# Patient Record
Sex: Female | Born: 1945 | Race: White | Hispanic: No | Marital: Married | Smoking: Never smoker
Health system: Southern US, Community
[De-identification: ages and names within clinical notes are randomized; demographics above are authoritative.]

## PROBLEM LIST (undated history)

## (undated) DIAGNOSIS — K219 Gastro-esophageal reflux disease without esophagitis: Secondary | ICD-10-CM

## (undated) DIAGNOSIS — T7840XA Allergy, unspecified, initial encounter: Secondary | ICD-10-CM

## (undated) DIAGNOSIS — I10 Essential (primary) hypertension: Secondary | ICD-10-CM

## (undated) DIAGNOSIS — E0590 Thyrotoxicosis, unspecified without thyrotoxic crisis or storm: Secondary | ICD-10-CM

## (undated) DIAGNOSIS — R112 Nausea with vomiting, unspecified: Secondary | ICD-10-CM

## (undated) DIAGNOSIS — H33319 Horseshoe tear of retina without detachment, unspecified eye: Secondary | ICD-10-CM

## (undated) DIAGNOSIS — E785 Hyperlipidemia, unspecified: Secondary | ICD-10-CM

## (undated) DIAGNOSIS — Z9889 Other specified postprocedural states: Secondary | ICD-10-CM

## (undated) DIAGNOSIS — Z87442 Personal history of urinary calculi: Secondary | ICD-10-CM

## (undated) DIAGNOSIS — M1990 Unspecified osteoarthritis, unspecified site: Secondary | ICD-10-CM

## (undated) HISTORY — PX: BARTHOLIN GLAND CYST EXCISION: SHX565

## (undated) HISTORY — DX: Allergy, unspecified, initial encounter: T78.40XA

## (undated) HISTORY — PX: TONSILLECTOMY: SUR1361

## (undated) HISTORY — PX: TUBAL LIGATION: SHX77

## (undated) HISTORY — PX: CHOLECYSTECTOMY: SHX55

## (undated) HISTORY — PX: COLONOSCOPY: SHX174

## (undated) HISTORY — DX: Hyperlipidemia, unspecified: E78.5

## (undated) HISTORY — DX: Horseshoe tear of retina without detachment, unspecified eye: H33.319

## (undated) HISTORY — PX: OTHER SURGICAL HISTORY: SHX169

## (undated) HISTORY — PX: PLANTAR FASCIA SURGERY: SHX746

---

## 1997-11-27 ENCOUNTER — Other Ambulatory Visit: Admission: RE | Admit: 1997-11-27 | Discharge: 1997-11-27 | Payer: Self-pay | Admitting: Obstetrics and Gynecology

## 1998-04-12 HISTORY — PX: KNEE ARTHROSCOPY: SHX127

## 1998-12-11 ENCOUNTER — Other Ambulatory Visit: Admission: RE | Admit: 1998-12-11 | Discharge: 1998-12-11 | Payer: Self-pay | Admitting: Obstetrics and Gynecology

## 1998-12-11 ENCOUNTER — Encounter (INDEPENDENT_AMBULATORY_CARE_PROVIDER_SITE_OTHER): Payer: Self-pay | Admitting: Specialist

## 1999-12-15 ENCOUNTER — Other Ambulatory Visit: Admission: RE | Admit: 1999-12-15 | Discharge: 1999-12-15 | Payer: Self-pay | Admitting: Obstetrics and Gynecology

## 2001-01-17 ENCOUNTER — Other Ambulatory Visit: Admission: RE | Admit: 2001-01-17 | Discharge: 2001-01-17 | Payer: Self-pay | Admitting: Obstetrics and Gynecology

## 2002-02-16 ENCOUNTER — Other Ambulatory Visit: Admission: RE | Admit: 2002-02-16 | Discharge: 2002-02-16 | Payer: Self-pay | Admitting: Obstetrics and Gynecology

## 2002-08-15 ENCOUNTER — Ambulatory Visit (HOSPITAL_COMMUNITY): Admission: RE | Admit: 2002-08-15 | Discharge: 2002-08-15 | Payer: Self-pay | Admitting: Gastroenterology

## 2003-03-15 ENCOUNTER — Other Ambulatory Visit: Admission: RE | Admit: 2003-03-15 | Discharge: 2003-03-15 | Payer: Self-pay | Admitting: Obstetrics and Gynecology

## 2003-09-23 ENCOUNTER — Other Ambulatory Visit: Admission: RE | Admit: 2003-09-23 | Discharge: 2003-09-23 | Payer: Self-pay | Admitting: Diagnostic Radiology

## 2004-04-14 ENCOUNTER — Other Ambulatory Visit: Admission: RE | Admit: 2004-04-14 | Discharge: 2004-04-14 | Payer: Self-pay | Admitting: Obstetrics and Gynecology

## 2004-08-10 ENCOUNTER — Encounter: Admission: RE | Admit: 2004-08-10 | Discharge: 2004-08-10 | Payer: Self-pay | Admitting: Family Medicine

## 2004-08-28 ENCOUNTER — Encounter: Admission: RE | Admit: 2004-08-28 | Discharge: 2004-08-28 | Payer: Self-pay | Admitting: Gastroenterology

## 2004-12-21 ENCOUNTER — Ambulatory Visit (HOSPITAL_COMMUNITY): Admission: RE | Admit: 2004-12-21 | Discharge: 2004-12-21 | Payer: Self-pay | Admitting: General Surgery

## 2004-12-21 ENCOUNTER — Encounter (INDEPENDENT_AMBULATORY_CARE_PROVIDER_SITE_OTHER): Payer: Self-pay | Admitting: Specialist

## 2005-05-18 ENCOUNTER — Other Ambulatory Visit: Admission: RE | Admit: 2005-05-18 | Discharge: 2005-05-18 | Payer: Self-pay | Admitting: Obstetrics and Gynecology

## 2009-07-22 ENCOUNTER — Emergency Department (HOSPITAL_COMMUNITY): Admission: EM | Admit: 2009-07-22 | Discharge: 2009-07-22 | Payer: Self-pay | Admitting: Emergency Medicine

## 2009-08-20 ENCOUNTER — Encounter: Payer: Self-pay | Admitting: Gastroenterology

## 2009-08-22 ENCOUNTER — Encounter (INDEPENDENT_AMBULATORY_CARE_PROVIDER_SITE_OTHER): Payer: Self-pay | Admitting: *Deleted

## 2009-09-09 ENCOUNTER — Encounter (INDEPENDENT_AMBULATORY_CARE_PROVIDER_SITE_OTHER): Payer: Self-pay | Admitting: *Deleted

## 2009-09-12 ENCOUNTER — Ambulatory Visit: Payer: Self-pay | Admitting: Gastroenterology

## 2009-09-23 ENCOUNTER — Ambulatory Visit: Payer: Self-pay | Admitting: Gastroenterology

## 2010-05-14 NOTE — Letter (Signed)
Summary: [facility] Instructions  [facility]  [address] [place], [place] [address]   Phone: [phone]  Fax: [phone]       Deanna Erickson    [birth year]    MRN: [id number]        Procedure Day /Date:  Tuesday 09/23/2009     Arrival Time: 10:30 am      Procedure Time: 11:30 am     Location of Procedure:                    _x _  [facility] (4th Floor)                        PREPARATION FOR COLONOSCOPY WITH MOVIPREP   Starting 5 days prior to your procedure Thursday 6/9 do not eat nuts, seeds, popcorn, corn, beans, peas,  salads, or any raw vegetables.  Do not take any fiber supplements (e.g. Metamucil, Citrucel, and Benefiber).  THE DAY BEFORE YOUR PROCEDURE         DATE: Monday 6/13  1.  Drink clear liquids the entire day-NO SOLID FOOD  2.  Do not drink anything colored red or purple.  Avoid juices with pulp.  No orange juice.  3.  Drink at least 64 oz. (8 glasses) of fluid/clear liquids during the day to prevent dehydration and help the prep work efficiently.  CLEAR LIQUIDS INCLUDE: Water Jello Ice Popsicles Tea (sugar ok, no milk/cream) Powdered fruit flavored drinks Coffee (sugar ok, no milk/cream) Gatorade Juice: apple, white grape, white cranberry  Lemonade Clear bullion, consomm, broth Carbonated beverages (any kind) Strained chicken noodle soup Hard Candy                             4.  In the morning, mix first dose of MoviPrep solution:    Empty 1 Pouch A and 1 Pouch B into the disposable container    Add lukewarm drinking water to the top line of the container. Mix to dissolve    Refrigerate (mixed solution should be used within 24 hrs)  5.  Begin drinking the prep at 5:00 p.m. The MoviPrep container is divided by 4 marks.   Every 15 minutes drink the solution down to the next mark (approximately 8 oz) until the full liter is complete.   6.  Follow completed prep with 16 oz of clear liquid of your choice (Nothing  red or purple).  Continue to drink clear liquids until bedtime.  7.  Before going to bed, mix second dose of MoviPrep solution:    Empty 1 Pouch A and 1 Pouch B into the disposable container    Add lukewarm drinking water to the top line of the container. Mix to dissolve    Refrigerate  THE DAY OF YOUR PROCEDURE      DATE: Tuesday 6/14  Beginning at 6:30 a.m. (5 hours before procedure):         1. Every 15 minutes, drink the solution down to the next mark (approx 8 oz) until the full liter is complete.  2. Follow completed prep with 16 oz. of clear liquid of your choice.    3. You may drink clear liquids until 9:30 am (2 HOURS BEFORE PROCEDURE).   MEDICATION INSTRUCTIONS  Unless otherwise instructed, you should take regular prescription medications with a small sip of water   as early as possible the morning of  your procedure.   Additional medication instructions:  hold diuretic if in with BP med.         OTHER INSTRUCTIONS  You will need a responsible adult at least 66 years of age to accompany you and drive you home.   This person must remain in the waiting room during your procedure.  Wear loose fitting clothing that is easily removed.  Leave jewelry and other valuables at home.  However, you may wish to bring a book to read or  an iPod/MP3 player to listen to music as you wait for your procedure to start.  Remove all body piercing jewelry and leave at home.  Total time from sign-in until discharge is approximately 2-3 hours.  You should go home directly after your procedure and rest.  You can resume normal activities the  day after your procedure.  The day of your procedure you should not:   Drive   Make legal decisions   Operate machinery   Drink alcohol   Return to work  You will receive specific instructions about eating, activities and medications before you leave.    The above instructions have been reviewed and explained to me by   Sherren Kerns RN  September 12, 2009 11:32 AM    I fully understand and can verbalize these instructions _____________________________ Date _________

## 2010-05-14 NOTE — Miscellaneous (Signed)
Summary: previsit/rm  Clinical Lists Changes  Medications: Added new medication of MOVIPREP 100 GM  SOLR (PEG-KCL-NACL-NASULF-NA ASC-C) As per prep instructions. - Signed Rx of MOVIPREP 100 GM  SOLR (PEG-KCL-NACL-NASULF-NA ASC-C) As per prep instructions.;  #1 x 0;  Signed;  Entered by: Sherren Kerns RN;  Authorized by: Louis Meckel MD;  Method used: Electronically to [facility] [address] [place], [place], [place]  [address], Ph: [phone], Fax: [phone] Observations: Added new observation of NKA: T (09/12/2009 10:43)    Prescriptions: MOVIPREP 100 GM  SOLR (PEG-KCL-NACL-NASULF-NA ASC-C) As per prep instructions.  #1 x 0   Entered by:   Sherren Kerns RN   Authorized by:   Louis Meckel MD   Signed by:   Sherren Kerns RN on 09/12/2009   Method used:   Electronically to        [facility] (retail)       [address]       [place], [place]  [address]       Ph: [phone]       Fax: [phone]   RxID:   [id number]

## 2010-05-14 NOTE — Procedures (Signed)
Summary: Colonoscopy  Patient: Deanna Erickson Note: All result statuses are Final unless otherwise noted.  Tests: (1) Colonoscopy (COL)   COL Colonoscopy           DONE     [facility]     [address]     [place], [place]  [address]           COLONOSCOPY PROCEDURE REPORT           PATIENT:  [name], [name]  MR#:  [id number]     BIRTHDATE:  [birth year], 63 yrs. old  GENDER:  female           ENDOSCOPIST:  Barbette Hair. Arlyce Dice, MD     Referred by:           PROCEDURE DATE:  09/23/2009     PROCEDURE:  Diagnostic Colonoscopy     ASA CLASS:  Class II     INDICATIONS:  1) Routine Risk Screening  2) family Hx of polyps           MEDICATIONS:   Fentanyl 75 mcg IV, Versed 9 mg IV           DESCRIPTION OF PROCEDURE:   After the risks benefits and     alternatives of the procedure were thoroughly explained, informed     consent was obtained.  Digital rectal exam was performed and     revealed no abnormalities.   The LB CF-[id number] endoscope     was introduced through the anus and advanced to the cecum, which     was identified by both the appendix and ileocecal valve, without     limitations.  The quality of the prep was excellent, using     MoviPrep.  The instrument was then slowly withdrawn as the colon     was fully examined.     <<PROCEDUREIMAGES>>           FINDINGS:  A normal appearing cecum, ileocecal valve, and     appendiceal orifice were identified. The ascending, hepatic     flexure, transverse, splenic flexure, descending, sigmoid colon,     and rectum appeared unremarkable (see image1, image2, image3,     image5, image7, image8, and image9).   Retroflexed views in the     rectum revealed no abnormalities.    The time to cecum =  3.50     minutes. The scope was then withdrawn (time =  6.0  min) from the     patient and the procedure completed.           COMPLICATIONS:  None           ENDOSCOPIC IMPRESSION:     1) Normal colon     RECOMMENDATIONS:     1)  Continue current colorectal screening recommendations for     "routine risk" patients with a repeat colonoscopy in 10 years.           REPEAT EXAM:  In 10 year(s) for Colonoscopy.           ______________________________     Barbette Hair. Arlyce Dice, MD           CC: Marjory Lies, MD, Richardean Chimera, MD           n.     eSIGNED:   Barbette Hair. Kyli Sorter at 09/23/2009 11:44 AM           Evorn Gong, [id number]  Note: An exclamation mark (!) indicates a result  that was not dispersed into the flowsheet. Document Creation Date: 09/23/2009 11:46 AM _______________________________________________________________________  (1) Order result status: Final Collection or observation date-time: 09/23/2009 11:40 Requested date-time:  Receipt date-time:  Reported date-time:  Referring Physician:   Ordering Physician: Melvia Heaps [phone]) Specimen Source:  Source: Launa Grill Order Number: [id number] Lab site:   Appended Document: Colonoscopy    Clinical Lists Changes  Observations: Added new observation of COLONNXTDUE: 09/2019 (09/23/2009 13:08)

## 2010-05-14 NOTE — Letter (Signed)
Summary: Previsit letter  [facility]  [address] [place], [place] [address]   Phone: [phone]  Fax: [phone]       08/22/2009 MRN: [id number]  [facility] [address] [place], [place]  [address]  Dear Ms. Slabaugh,  Welcome to the Gastroenterology Division at [facility].    You are scheduled to see a nurse for your pre-procedure visit on 09-09-09 at 10:00a.m. on the 3rd floor at [facility], [address]  We ask that you try to arrive at our office 15 minutes prior to your appointment time to allow for check-in.  Your nurse visit will consist of discussing your medical and surgical history, your immediate family medical history, and your medications.    Please bring a complete list of all your medications or, if you prefer, bring the medication bottles and we will list them.  We will need to be aware of both prescribed and over the counter drugs.  We will need to know exact dosage information as well.  If you are on blood thinners (Coumadin, Plavix, Aggrenox, Ticlid, etc.) please call our office today/prior to your appointment, as we need to consult with your physician about holding your medication.   Please be prepared to read and sign documents such as consent forms, a financial agreement, and acknowledgement forms.  If necessary, and with your consent, a friend or relative is welcome to sit-in on the nurse visit with you.  Please bring your insurance card so that we may make a copy of it.  If your insurance requires a referral to see a specialist, please bring your referral form from your primary care physician.  No co-pay is required for this nurse visit.     If you cannot keep your appointment, please call [phone] to cancel or reschedule prior to your appointment date.  This allows [place] the opportunity to schedule an appointment for another patient in need of care.    Thank you for choosing [facility] for your medical  needs.  We appreciate the opportunity to care for you.  Please visit [place] at our website  to learn more about our practice.                     Sincerely.                                                                                                                   The Gastroenterology Division

## 2010-05-14 NOTE — Letter (Signed)
Summary: [facility]   Imported By: Lester Fairdale 10/08/2009 10:35:52  _____________________________________________________________________  External Attachment:    Type:   Image     Comment:   External Document

## 2010-07-01 LAB — BASIC METABOLIC PANEL
BUN: 7 mg/dL (ref 6–23)
Creatinine, Ser: 0.79 mg/dL (ref 0.4–1.2)
GFR calc Af Amer: >60 mL/min (ref >60)
GFR calc non Af Amer: >60 mL/min (ref >60)
Glucose, Bld: 118 mg/dL — ABNORMAL HIGH (ref 70–99)
Potassium: 4.3 mEq/L (ref 3.5–5.1)

## 2010-07-01 LAB — DIFFERENTIAL
Eosinophils Absolute: 0 10*3/uL (ref 0.0–0.7)
Lymphs Abs: 1.2 10*3/uL (ref 0.7–4.0)
Monocytes Absolute: 0.3 10*3/uL (ref 0.1–1.0)

## 2010-07-01 LAB — URINALYSIS, ROUTINE W REFLEX MICROSCOPIC
Ketones, ur: NEGATIVE mg/dL
Nitrite: NEGATIVE
pH: 8 (ref 5.0–8.0)

## 2010-07-01 LAB — CBC
HCT: 38.8 % (ref 36.0–46.0)
MCHC: 33.8 g/dL (ref 30.0–36.0)
MCV: 93.9 fL (ref 78.0–100.0)
Platelets: 245 10*3/uL (ref 150–400)
RDW: 13.4 % (ref 11.5–15.5)

## 2010-07-01 LAB — POCT CARDIAC MARKERS

## 2010-08-28 NOTE — Op Note (Signed)
   NAME:  Deanna Erickson, ARVANITIS                            ACCOUNT NO.:  [id number]   MEDICAL RECORD NO.:  [id number]                   PATIENT TYPE:  AMB   LOCATION:  ENDO                                 FACILITY:  [facility]   PHYSICIAN:  Anselmo Rod, M.D.               DATE OF BIRTH:  1945/11/26   DATE OF PROCEDURE:  08/15/2002  DATE OF DISCHARGE:                                 OPERATIVE REPORT   PROCEDURE PERFORMED:  Screening colonoscopy.   ENDOSCOPIST:  Anselmo Rod, M.D.   INSTRUMENT USED:  Olympus video colonoscope   INDICATIONS FOR PROCEDURE:  A 65 year old white female underwent screening  colonoscopy. Rule out colonic polyps, masses, etc.   PREPROCEDURE PREPARATION:  Informed consent was procured from the patient.  The patient was fasted for eight hours prior to the procedure and prepped  with a bottle of MiraLax and Gatorade the night prior to the procedure.   PREPROCEDURE PHYSICAL:  VITAL SIGNS:  The patient had stable vital signs.  NECK:  Supple.  CHEST:  Clear to auscultation. S1 and S2 regular.  ABDOMEN:  Soft with normal bowel sounds.   DESCRIPTION OF PROCEDURE:  The patient was placed in the left lateral  decubitus position and sedated with 100 mg of Demerol and 10 mg of Versed  intravenously. Once the patient was adequately sedated and maintained on low  oxygen and continuous cardiac monitoring, the Olympus video colonoscope was  advanced into the rectum to the cecum.  The appendiceal orifice and  ileocecal valve were clearly visualized and photographed.  Small internal  hemorrhoids were seen on retroflexion of the rectum.  No masses or polyps  were present. The patient tolerated the procedure well without  complications.   IMPRESSION:  Essentially normal colonoscopy except for small nonbleeding  internal hemorrhoids.   RECOMMENDATIONS:  1. High fiber diet with liberal fluid intake has been recommended.  2.     Outpatient follow up on a p.r.n. basis.  3.  Repeat colorectal cancer screening is recommended in the next five years     unless the patient develops any abnormal symptoms in the interim.                                               Anselmo Rod, M.D.    JNM/MEDQ  D:  08/15/2002  T:  08/16/2002  Job:  [id number]   cc:   Teena Irani. Arlyce Dice, M.D.  [address]  [place]  [place] [address]  Fax: [phone]

## 2010-08-28 NOTE — Op Note (Signed)
NAME:  Deanna Erickson, Deanna Erickson                ACCOUNT NO.:  [id number]   MEDICAL RECORD NO.:  [id number]          PATIENT TYPE:  AMB   LOCATION:  DAY                          FACILITY:  [facility]   PHYSICIAN:  Adolph Pollack, M.D.DATE OF BIRTH:  Feb 18, 1946   DATE OF PROCEDURE:  DATE OF DISCHARGE:                                 OPERATIVE REPORT   PREOPERATIVE DIAGNOSIS:  Gallbladder polypoid mass.   POSTOPERATIVE DIAGNOSIS:  Gallbladder polypoid mass.   PROCEDURE:  Laparoscopic cholecystectomy with intraoperative cholangiogram.   SURGEON:  Adolph Pollack, M.D.   ASSISTANT:  Cyndia Bent, M.D.   ANESTHESIA:  General.   INDICATIONS:  Ms. Blaschke is a 58-year female who had some lower abdominal  pain and had a workup done by Dr. Loreta Ave. She had an ultrasound that  demonstrated a polypoid mass in the gallbladder. I saw her in the office and  we discussed options including serial ultrasounds exam versus  cholecystectomy and she chose the cholecystectomy. We discussed the  procedure and the risks preoperatively. Liver function tests were within  normal limits preoperatively.   TECHNIQUE:  She is seen in the holding area and then brought to the  operating room, placed supine on the operating table and a general  anesthetic was administered. The abdominal wall was sterilely prepped and  draped. Dilute Marcaine solution was infiltrated in the subumbilical region.  A transverse subumbilical scar was then reincised through the skin and  subcutaneous tissue. The midline fascia was identified and a small incision  made in it and into the peritoneum entering the peritoneal cavity. A  pursestring suture of #0 Vicryl was placed around the fascial edges. A  Hassan trocar was introduced into the peritoneal cavity and pneumoperitoneum  was created by insufflation of CO2 gas.   Next the laparoscope was introduced. She was placed in the reverse  Trendelenburg position with the right side tilted  slightly up. Under direct  vision, an 11 mL trocar was placed in the epigastric incision and two 5 mm  trocars were placed through right mid abdominal wall incisions. The fundus  of the gallbladder was grasped. It was retracted toward the right shoulder.  No significant omental adhesions to the gallbladder were noted. The  infundibulum was grasped and using blunt dissection, I mobilized the  infundibulum. I then isolated the cystic duct and noted the cystic artery  was running anteriorly to it going straight to the gallbladder. I created a  window around this anterior branch of the cystic artery and then clipped it  and divided it. I then created a window around the cystic duct. A clip was  placed just above the cystic duct gallbladder junction and a small incision  was made in the cystic duct. Bile was milked back. A cholangiocath was  passed through the anterior abdominal wall into the cystic duct and a  cholangiogram was performed.   Under real-time fluoroscopy, dilute contrast material was injected into the  cystic duct which was of moderate to long length. The common hepatic, right  and left hepatic and common bile ducts all filled  and contrast drained into  the duodenum promptly without evidence of obstruction. Final report is  pending the radiologist interpretation.   The cholangiocath was removed, the cystic duct was then clipped three times  proximally and divided. The posterior branch of the cystic artery was  clipped and divided. The gallbladder was dissected free from liver using  electrocautery and placed in an Endopouch bag.   The gallbladder fossa was irrigated. It was hemostatic and no evidence of  blue bile leak was noted. The gallbladder was then removed through the  subumbilical port in the Endopouch bag and the subumbilical fascial defect  closed by tightening up and tying down the pursestring suture. The  perihepatic area was irrigated and fluid returned clear. The  remaining  trocars were removed and the pneumoperitoneum was released.   The skin incisions were closed with 4-0 Monocryl subcuticular stitches.  Steri-Strips and sterile dressings were applied. She tolerated the procedure  well without any apparent complications and was taken to the recovery room  in satisfactory condition.      Adolph Pollack, M.D.  Electronically Signed     TJR/MEDQ  D:  12/21/2004  T:  12/21/2004  Job:  [id number]   cc:   Anselmo Rod, M.D.  [address]  [address]  [place]  [place] [address]  Fax: [phone]   Teena Irani. Arlyce Dice, M.D.  [address]  [place]  [place] [address]  Fax: [phone]

## 2011-06-01 ENCOUNTER — Other Ambulatory Visit: Payer: Self-pay | Admitting: Family Medicine

## 2011-06-01 DIAGNOSIS — E040 Nontoxic diffuse goiter: Secondary | ICD-10-CM

## 2011-06-02 ENCOUNTER — Other Ambulatory Visit (HOSPITAL_COMMUNITY): Payer: Self-pay | Admitting: Family Medicine

## 2011-06-02 ENCOUNTER — Other Ambulatory Visit: Payer: Self-pay

## 2011-06-02 DIAGNOSIS — E040 Nontoxic diffuse goiter: Secondary | ICD-10-CM

## 2011-06-08 ENCOUNTER — Ambulatory Visit (HOSPITAL_COMMUNITY)
Admission: RE | Admit: 2011-06-08 | Discharge: 2011-06-08 | Disposition: A | Discharge status: Home / Self Care | Payer: Medicare Other | Source: Ambulatory Visit | Attending: Family Medicine | Admitting: Family Medicine

## 2011-06-08 ENCOUNTER — Encounter (HOSPITAL_COMMUNITY)
Admission: RE | Admit: 2011-06-08 | Discharge: 2011-06-08 | Disposition: A | Discharge status: Home / Self Care | Payer: Medicare Other | Source: Ambulatory Visit | Attending: Family Medicine | Admitting: Family Medicine

## 2011-06-08 DIAGNOSIS — E040 Nontoxic diffuse goiter: Secondary | ICD-10-CM

## 2011-06-08 DIAGNOSIS — E049 Nontoxic goiter, unspecified: Secondary | ICD-10-CM | POA: Insufficient documentation

## 2011-06-09 ENCOUNTER — Encounter (HOSPITAL_COMMUNITY)
Admission: RE | Admit: 2011-06-09 | Discharge: 2011-06-09 | Disposition: A | Discharge status: Home / Self Care | Payer: Medicare Other | Source: Ambulatory Visit | Attending: Family Medicine | Admitting: Family Medicine

## 2011-06-09 DIAGNOSIS — E049 Nontoxic goiter, unspecified: Secondary | ICD-10-CM | POA: Insufficient documentation

## 2011-06-09 DIAGNOSIS — R220 Localized swelling, mass and lump, head: Secondary | ICD-10-CM | POA: Insufficient documentation

## 2011-06-09 DIAGNOSIS — M542 Cervicalgia: Secondary | ICD-10-CM | POA: Insufficient documentation

## 2011-06-09 DIAGNOSIS — E042 Nontoxic multinodular goiter: Secondary | ICD-10-CM | POA: Insufficient documentation

## 2011-06-09 MED ORDER — SODIUM PERTECHNETATE TC 99M INJECTION
10.0000 | Freq: Once | INTRAVENOUS | Status: AC | PRN
  Administered 2011-06-09: 10.3 via INTRAVENOUS

## 2011-06-09 MED ORDER — SODIUM IODIDE I 131 CAPSULE
13.3000 | Freq: Once | INTRAVENOUS | Status: AC | PRN
  Administered 2011-06-08: 13.3 via ORAL

## 2012-03-12 HISTORY — PX: RETINAL DETACHMENT SURGERY: SHX105

## 2012-05-11 ENCOUNTER — Encounter (HOSPITAL_COMMUNITY): Payer: Self-pay | Admitting: Pharmacy Technician

## 2012-05-16 ENCOUNTER — Encounter (HOSPITAL_COMMUNITY): Payer: Self-pay

## 2012-05-16 ENCOUNTER — Encounter (HOSPITAL_COMMUNITY)
Admission: RE | Admit: 2012-05-16 | Discharge: 2012-05-16 | Disposition: A | Discharge status: Home / Self Care | Payer: Medicare Other | Source: Ambulatory Visit | Attending: Ophthalmology | Admitting: Ophthalmology

## 2012-05-16 HISTORY — DX: Nausea with vomiting, unspecified: R11.2

## 2012-05-16 HISTORY — DX: Personal history of urinary calculi: Z87.442

## 2012-05-16 HISTORY — DX: Essential (primary) hypertension: I10

## 2012-05-16 HISTORY — DX: Thyrotoxicosis, unspecified without thyrotoxic crisis or storm: E05.90

## 2012-05-16 HISTORY — DX: Other specified postprocedural states: Z98.890

## 2012-05-16 LAB — BASIC METABOLIC PANEL
Calcium: 9.7 mg/dL (ref 8.4–10.5)
Creatinine, Ser: 0.85 mg/dL (ref 0.50–1.10)
GFR calc non Af Amer: 69 mL/min — ABNORMAL LOW (ref >90)
Glucose, Bld: 88 mg/dL (ref 70–99)
Sodium: 140 mEq/L (ref 135–145)

## 2012-05-16 LAB — HEMOGLOBIN AND HEMATOCRIT, BLOOD: HCT: 41.5 % (ref 36.0–46.0)

## 2012-05-16 NOTE — Patient Instructions (Addendum)
Your procedure is scheduled on: 05/18/2012  Report to [facility] at 1000    AM.  Call this number if you have problems the morning of surgery: [phone]   Do not eat food or drink liquids :After Midnight.      Take these medicines the morning of surgery with A SIP OF WATER:zyrtec,tapazole,metoprolol,benicar,prilosec   Do not wear jewelry, make-up or nail polish.  Do not wear lotions, powders, or perfumes.   Do not shave 48 hours prior to surgery.  Do not bring valuables to the hospital.  Contacts, dentures or bridgework may not be worn into surgery.  Leave suitcase in the car. After surgery it may be brought to your room.  For patients admitted to the hospital, checkout time is 11:00 AM the day of discharge.   Patients discharged the day of surgery will not be allowed to drive home.  :     Please read over the following fact sheets that you were given: Coughing and Deep Breathing, Surgical Site Infection Prevention, Anesthesia Post-op Instructions and Care and Recovery After Surgery    Cataract A cataract is a clouding of the lens of the eye. When a lens becomes cloudy, vision is reduced based on the degree and nature of the clouding. Many cataracts reduce vision to some degree. Some cataracts make people more near-sighted as they develop. Other cataracts increase glare. Cataracts that are ignored and become worse can sometimes look white. The white color can be seen through the pupil. CAUSES   Aging. However, cataracts may occur at any age, even in newborns.   Certain drugs.   Trauma to the eye.   Certain diseases such as diabetes.   Specific eye diseases such as chronic inflammation inside the eye or a sudden attack of a rare form of glaucoma.   Inherited or acquired medical problems.  SYMPTOMS   Gradual, progressive drop in vision in the affected eye.   Severe, rapid visual loss. This most often happens when trauma is the cause.  DIAGNOSIS  To detect a cataract, an eye  doctor examines the lens. Cataracts are best diagnosed with an exam of the eyes with the pupils enlarged (dilated) by drops.  TREATMENT  For an early cataract, vision may improve by using different eyeglasses or stronger lighting. If that does not help your vision, surgery is the only effective treatment. A cataract needs to be surgically removed when vision loss interferes with your everyday activities, such as driving, reading, or watching TV. A cataract may also have to be removed if it prevents examination or treatment of another eye problem. Surgery removes the cloudy lens and usually replaces it with a substitute lens (intraocular lens, IOL).  At a time when both you and your doctor agree, the cataract will be surgically removed. If you have cataracts in both eyes, only one is usually removed at a time. This allows the operated eye to heal and be out of danger from any possible problems after surgery (such as infection or poor wound healing). In rare cases, a cataract may be doing damage to your eye. In these cases, your caregiver may advise surgical removal right away. The vast majority of people who have cataract surgery have better vision afterward. HOME CARE INSTRUCTIONS  If you are not planning surgery, you may be asked to do the following:  Use different eyeglasses.   Use stronger or brighter lighting.   Ask your eye doctor about reducing your medicine dose or changing medicines if  it is thought that a medicine caused your cataract. Changing medicines does not make the cataract go away on its own.   Become familiar with your surroundings. Poor vision can lead to injury. Avoid bumping into things on the affected side. You are at a higher risk for tripping or falling.   Exercise extreme care when driving or operating machinery.   Wear sunglasses if you are sensitive to bright light or experiencing problems with glare.  SEEK IMMEDIATE MEDICAL CARE IF:   You have a worsening or sudden  vision loss.   You notice redness, swelling, or increasing pain in the eye.   You have a fever.  Document Released: 03/29/2005 Document Revised: 03/18/2011 Document Reviewed: 11/20/2010 [facility] Patient Information 2012 [place], [place].PATIENT INSTRUCTIONS POST-ANESTHESIA  IMMEDIATELY FOLLOWING SURGERY:  Do not drive or operate machinery for the first twenty four hours after surgery.  Do not make any important decisions for twenty four hours after surgery or while taking narcotic pain medications or sedatives.  If you develop intractable nausea and vomiting or a severe headache please notify your doctor immediately.  FOLLOW-UP:  Please make an appointment with your surgeon as instructed. You do not need to follow up with anesthesia unless specifically instructed to do so.  WOUND CARE INSTRUCTIONS (if applicable):  Keep a dry clean dressing on the anesthesia/puncture wound site if there is drainage.  Once the wound has quit draining you may leave it open to air.  Generally you should leave the bandage intact for twenty four hours unless there is drainage.  If the epidural site drains for more than 36-48 hours please call the anesthesia department.  QUESTIONS?:  Please feel free to call your physician or the hospital operator if you have any questions, and they will be happy to assist you.

## 2012-05-17 MED ORDER — PHENYLEPHRINE HCL 2.5 % OP SOLN
OPHTHALMIC | Status: AC
  Filled 2012-05-17: qty 2

## 2012-05-17 MED ORDER — TETRACAINE HCL 0.5 % OP SOLN
OPHTHALMIC | Status: AC
  Filled 2012-05-17: qty 2

## 2012-05-17 MED ORDER — LIDOCAINE HCL (PF) 1 % IJ SOLN
INTRAMUSCULAR | Status: AC
  Filled 2012-05-17: qty 2

## 2012-05-17 MED ORDER — CYCLOPENTOLATE-PHENYLEPHRINE 0.2-1 % OP SOLN
OPHTHALMIC | Status: AC
  Filled 2012-05-17: qty 2

## 2012-05-17 MED ORDER — NEOMYCIN-POLYMYXIN-DEXAMETH 3.5-10000-0.1 OP OINT
TOPICAL_OINTMENT | OPHTHALMIC | Status: AC
  Filled 2012-05-17: qty 3.5

## 2012-05-17 MED ORDER — LIDOCAINE HCL 3.5 % OP GEL
OPHTHALMIC | Status: AC
  Filled 2012-05-17: qty 5

## 2012-05-18 ENCOUNTER — Encounter (HOSPITAL_COMMUNITY): Payer: Self-pay | Admitting: Ophthalmology

## 2012-05-18 ENCOUNTER — Ambulatory Visit (HOSPITAL_COMMUNITY): Payer: Medicare Other | Admitting: Anesthesiology

## 2012-05-18 ENCOUNTER — Encounter (HOSPITAL_COMMUNITY): Payer: Self-pay | Admitting: Anesthesiology

## 2012-05-18 ENCOUNTER — Ambulatory Visit (HOSPITAL_COMMUNITY)
Admission: RE | Admit: 2012-05-18 | Discharge: 2012-05-18 | Disposition: A | Discharge status: Home / Self Care | Payer: Medicare Other | Source: Ambulatory Visit | Attending: Ophthalmology | Admitting: Ophthalmology

## 2012-05-18 ENCOUNTER — Encounter (HOSPITAL_COMMUNITY)
Admission: RE | Disposition: A | Discharge status: Home / Self Care | Payer: Self-pay | Source: Ambulatory Visit | Attending: Ophthalmology

## 2012-05-18 DIAGNOSIS — Z01812 Encounter for preprocedural laboratory examination: Secondary | ICD-10-CM | POA: Insufficient documentation

## 2012-05-18 DIAGNOSIS — Z01810 Encounter for preprocedural cardiovascular examination: Secondary | ICD-10-CM | POA: Insufficient documentation

## 2012-05-18 DIAGNOSIS — H2589 Other age-related cataract: Secondary | ICD-10-CM | POA: Insufficient documentation

## 2012-05-18 DIAGNOSIS — I10 Essential (primary) hypertension: Secondary | ICD-10-CM | POA: Insufficient documentation

## 2012-05-18 HISTORY — PX: CATARACT EXTRACTION W/PHACO: SHX586

## 2012-05-18 SURGERY — PHACOEMULSIFICATION, CATARACT, WITH IOL INSERTION
Anesthesia: Monitor Anesthesia Care | Site: Eye | Laterality: Right | Wound class: Clean

## 2012-05-18 MED ORDER — POVIDONE-IODINE 5 % OP SOLN
OPHTHALMIC | Status: DC | PRN
  Administered 2012-05-18: 1 via OPHTHALMIC

## 2012-05-18 MED ORDER — PHENYLEPHRINE HCL 2.5 % OP SOLN
1.0000 [drp] | OPHTHALMIC | Status: AC
  Administered 2012-05-18 (×3): 1 [drp] via OPHTHALMIC

## 2012-05-18 MED ORDER — BSS IO SOLN
INTRAOCULAR | Status: DC | PRN
  Administered 2012-05-18: 15 mL via INTRAOCULAR

## 2012-05-18 MED ORDER — NEOMYCIN-POLYMYXIN-DEXAMETH 0.1 % OP OINT
TOPICAL_OINTMENT | OPHTHALMIC | Status: DC | PRN
  Administered 2012-05-18: 1 via OPHTHALMIC

## 2012-05-18 MED ORDER — ONDANSETRON HCL 4 MG/2ML IJ SOLN
INTRAMUSCULAR | Status: AC
  Filled 2012-05-18: qty 2

## 2012-05-18 MED ORDER — LIDOCAINE 3.5 % OP GEL OPTIME - NO CHARGE
OPHTHALMIC | Status: DC | PRN
  Administered 2012-05-18: 1 [drp] via OPHTHALMIC

## 2012-05-18 MED ORDER — MIDAZOLAM HCL 2 MG/2ML IJ SOLN
1.0000 mg | INTRAMUSCULAR | Status: DC | PRN
  Administered 2012-05-18: 2 mg via INTRAVENOUS

## 2012-05-18 MED ORDER — LIDOCAINE HCL (PF) 1 % IJ SOLN
INTRAMUSCULAR | Status: DC | PRN
  Administered 2012-05-18: .6 mL

## 2012-05-18 MED ORDER — PROVISC 10 MG/ML IO SOLN
INTRAOCULAR | Status: DC | PRN
  Administered 2012-05-18: 8.5 mg via INTRAOCULAR

## 2012-05-18 MED ORDER — LIDOCAINE HCL 3.5 % OP GEL
1.0000 "application " | Freq: Once | OPHTHALMIC | Status: AC
  Administered 2012-05-18: 1 via OPHTHALMIC

## 2012-05-18 MED ORDER — TETRACAINE HCL 0.5 % OP SOLN
1.0000 [drp] | OPHTHALMIC | Status: AC
  Administered 2012-05-18 (×3): 1 [drp] via OPHTHALMIC

## 2012-05-18 MED ORDER — LACTATED RINGERS IV SOLN
INTRAVENOUS | Status: DC
  Administered 2012-05-18: 11:00:00 via INTRAVENOUS

## 2012-05-18 MED ORDER — CYCLOPENTOLATE-PHENYLEPHRINE 0.2-1 % OP SOLN
1.0000 [drp] | OPHTHALMIC | Status: AC
  Administered 2012-05-18 (×3): 1 [drp] via OPHTHALMIC

## 2012-05-18 MED ORDER — EPINEPHRINE HCL 1 MG/ML IJ SOLN
INTRAOCULAR | Status: DC | PRN
  Administered 2012-05-18: 11:00:00

## 2012-05-18 MED ORDER — MIDAZOLAM HCL 2 MG/2ML IJ SOLN
INTRAMUSCULAR | Status: AC
  Filled 2012-05-18: qty 2

## 2012-05-18 MED ORDER — ONDANSETRON HCL 4 MG/2ML IJ SOLN
4.0000 mg | Freq: Once | INTRAMUSCULAR | Status: AC
  Administered 2012-05-18: 4 mg via INTRAVENOUS

## 2012-05-18 SURGICAL SUPPLY — 33 items
CAPSULAR TENSION RING-AMO (OPHTHALMIC RELATED) IMPLANT
CLOTH BEACON ORANGE TIMEOUT ST (SAFETY) ×1 IMPLANT
EYE SHIELD UNIVERSAL CLEAR (GAUZE/BANDAGES/DRESSINGS) ×1 IMPLANT
GLOVE BIO SURGEON STRL SZ 6.5 (GLOVE) IMPLANT
GLOVE BIOGEL PI IND STRL 6.5 (GLOVE) IMPLANT
GLOVE BIOGEL PI IND STRL 7.0 (GLOVE) IMPLANT
GLOVE BIOGEL PI IND STRL 7.5 (GLOVE) IMPLANT
GLOVE BIOGEL PI INDICATOR 6.5 (GLOVE) ×1
GLOVE BIOGEL PI INDICATOR 7.0 (GLOVE) ×1
GLOVE BIOGEL PI INDICATOR 7.5 (GLOVE)
GLOVE ECLIPSE 6.5 STRL STRAW (GLOVE) IMPLANT
GLOVE ECLIPSE 7.0 STRL STRAW (GLOVE) IMPLANT
GLOVE ECLIPSE 7.5 STRL STRAW (GLOVE) IMPLANT
GLOVE EXAM NITRILE LRG STRL (GLOVE) IMPLANT
GLOVE EXAM NITRILE MD LF STRL (GLOVE) ×1 IMPLANT
GLOVE SKINSENSE NS SZ6.5 (GLOVE)
GLOVE SKINSENSE NS SZ7.0 (GLOVE)
GLOVE SKINSENSE STRL SZ6.5 (GLOVE) IMPLANT
GLOVE SKINSENSE STRL SZ7.0 (GLOVE) IMPLANT
GOWN STRL REIN XL XLG (GOWN DISPOSABLE) ×1 IMPLANT
KIT VITRECTOMY (OPHTHALMIC RELATED) IMPLANT
PAD ARMBOARD 7.5X6 YLW CONV (MISCELLANEOUS) ×1 IMPLANT
PROC W NO LENS (INTRAOCULAR LENS)
PROC W SPEC LENS (INTRAOCULAR LENS)
PROCESS W NO LENS (INTRAOCULAR LENS) IMPLANT
PROCESS W SPEC LENS (INTRAOCULAR LENS) IMPLANT
RING MALYGIN (MISCELLANEOUS) IMPLANT
SIGHTPATH CAT PROC W REG LENS (Ophthalmic Related) ×2 IMPLANT
SYR TB 1ML LL NO SAFETY (SYRINGE) ×1 IMPLANT
TAPE SURG TRANSPORE 1 IN (GAUZE/BANDAGES/DRESSINGS) IMPLANT
TAPE SURGICAL TRANSPORE 1 IN (GAUZE/BANDAGES/DRESSINGS) ×1
VISCOELASTIC ADDITIONAL (OPHTHALMIC RELATED) IMPLANT
WATER STERILE IRR 250ML POUR (IV SOLUTION) ×1 IMPLANT

## 2012-05-18 NOTE — Brief Op Note (Signed)
Pre-Op Dx: Cataract OD Post-Op Dx: Cataract OD Surgeon: Brok Stocking Anesthesia: Topical with MAC Surgery: Cataract Extraction with Intraocular lens Implant OD Implant: B&L enVista Specimen: None Complications: None 

## 2012-05-18 NOTE — Anesthesia Postprocedure Evaluation (Signed)
  Anesthesia Post-op Note  Patient: Deanna Erickson  Procedure(s) Performed: Procedure(s) (LRB) with comments: CATARACT EXTRACTION PHACO AND INTRAOCULAR LENS PLACEMENT (IOC) (Right) - CDE: 13.75  Patient Location: Short Stay  Anesthesia Type:MAC  Level of Consciousness: awake, alert , oriented and patient cooperative  Airway and Oxygen Therapy: Patient Spontanous Breathing  Post-op Pain: none  Post-op Assessment: Post-op Vital signs reviewed, Patient's Cardiovascular Status Stable, Respiratory Function Stable, Patent Airway, No signs of Nausea or vomiting and Adequate PO intake  Post-op Vital Signs: Reviewed and stable  Complications: No apparent anesthesia complications

## 2012-05-18 NOTE — Anesthesia Preprocedure Evaluation (Signed)
Anesthesia Evaluation  Patient identified by MRN, date of birth, ID band Patient awake    Reviewed: Allergy & Precautions, H&P , NPO status , Patient's Chart, lab work & pertinent test results  History of Anesthesia Complications (+) PONV  Airway Mallampati: II      Dental  (+) Teeth Intact   Pulmonary neg pulmonary ROS,  breath sounds clear to auscultation        Cardiovascular hypertension, Pt. on medications Rhythm:Regular Rate:Normal     Neuro/Psych    GI/Hepatic GERD-  Controlled and Medicated,  Endo/Other  Hyperthyroidism   Renal/GU      Musculoskeletal   Abdominal   Peds  Hematology   Anesthesia Other Findings   Reproductive/Obstetrics                           Anesthesia Physical Anesthesia Plan  ASA: II  Anesthesia Plan: MAC   Post-op Pain Management:    Induction: Intravenous  Airway Management Planned: Nasal Cannula  Additional Equipment:   Intra-op Plan:   Post-operative Plan:   Informed Consent: I have reviewed the patients History and Physical, chart, labs and discussed the procedure including the risks, benefits and alternatives for the proposed anesthesia with the patient or authorized representative who has indicated his/her understanding and acceptance.     Plan Discussed with:   Anesthesia Plan Comments:         Anesthesia Quick Evaluation

## 2012-05-18 NOTE — H&P (Signed)
I have reviewed the H&P, the patient was re-examined, and I have identified no interval changes in medical condition and plan of care since the history and physical of record  

## 2012-05-18 NOTE — Transfer of Care (Signed)
Immediate Anesthesia Transfer of Care Note  Patient: Deanna Erickson  Procedure(s) Performed: Procedure(s) (LRB) with comments: CATARACT EXTRACTION PHACO AND INTRAOCULAR LENS PLACEMENT (IOC) (Right) - CDE: 13.75  Patient Location: Short Stay  Anesthesia Type:MAC  Level of Consciousness: awake, alert , oriented and patient cooperative  Airway & Oxygen Therapy: Patient Spontanous Breathing  Post-op Assessment: Report given to PACU RN, Post -op Vital signs reviewed and stable and Patient moving all extremities  Post vital signs: Reviewed and stable  Complications: No apparent anesthesia complications

## 2012-05-19 ENCOUNTER — Encounter (HOSPITAL_COMMUNITY): Payer: Self-pay | Admitting: Ophthalmology

## 2012-05-19 NOTE — Op Note (Signed)
NAME:  Deanna Erickson, Deanna Erickson                ACCOUNT NO.:  [id number]  MEDICAL RECORD NO.:  [id number]  LOCATION:  [facility]                          FACILITY:  [facility]  PHYSICIAN:  Susanne Greenhouse, MD       DATE OF BIRTH:  07-03-1945  DATE OF PROCEDURE:  05/18/2012 DATE OF DISCHARGE:  05/18/2012                              OPERATIVE REPORT   PREOPERATIVE DIAGNOSIS:  Combined cataract, right eye, diagnosis code 366.19.  POSTOPERATIVE DIAGNOSIS:  Combined cataract, right eye, diagnosis code 366.19.  OPERATION PERFORMED:  Phacoemulsification with posterior chamber intraocular lens implantation, right eye.  SURGEON:  Bonne Dolores. Athalia Setterlund, MD  ANESTHESIA:  Topical with monitored anesthesia care with IV sedation.  OPERATIVE SUMMARY:  In the preoperative area, dilating drops were placed into the right eye.  The patient was then brought into the operating room where she was placed under general anesthesia.  The eye was then prepped and draped.  Beginning with a 75 blade, a paracentesis port was made at the surgeon's 2 o'clock position.  The anterior chamber was then filled with a 1% nonpreserved lidocaine solution with epinephrine.  This was followed by Viscoat to deepen the chamber.  A small fornix-based peritomy was performed superiorly.  Next, a single iris hook was placed through the limbus superiorly.  A 2.4-mm keratome blade was then used to make a clear corneal incision over the iris hook.  A bent cystotome needle and Utrata forceps were used to create a continuous tear capsulotomy.  Hydrodissection was performed using balanced salt solution on a fine cannula.  The lens nucleus was then removed using phacoemulsification in a quadrant cracking technique.  The cortical material was then removed with irrigation and aspiration.  The capsular bag and anterior chamber were refilled with Provisc.  The wound was widened to approximately 3 mm and a posterior chamber intraocular lens was placed into the capsular  bag without difficulty using an [employer] lens injecting system.  A single 10-0 nylon suture was then used to close the incision as well as stromal hydration.  The Provisc was removed from the anterior chamber and capsular bag with irrigation and aspiration.  At this point, the wounds were tested for leak, which were negative.  The anterior chamber remained deep and stable.  The patient tolerated the procedure well.  There were no operative complications, and she awoke from general anesthesia without problem.  No surgical specimens.  Prosthetic device used is a Theme park manager, model [employer], model number MX60, power of 21.0, serial number is [id number].          ______________________________ Susanne Greenhouse, MD     KEH/MEDQ  D:  05/18/2012  T:  05/19/2012  Job:  [id number]

## 2012-12-19 ENCOUNTER — Other Ambulatory Visit: Payer: Self-pay | Admitting: Orthopedic Surgery

## 2012-12-29 ENCOUNTER — Other Ambulatory Visit: Payer: Self-pay | Admitting: Orthopedic Surgery

## 2012-12-29 NOTE — Progress Notes (Signed)
  Preoperative surgical orders have been place into the Epic hospital system for Deanna Erickson on 12/29/2012, 12:39 PM  by Patrica Duel for surgery on 01/15/2013.  Preop Total Knee orders including Experal, PO Tylenol, and IV Decadron as long as there are no contraindications to the above medications. Avel Peace, PA-C

## 2013-01-01 ENCOUNTER — Encounter (HOSPITAL_COMMUNITY): Payer: Self-pay | Admitting: Pharmacy Technician

## 2013-01-02 ENCOUNTER — Other Ambulatory Visit (HOSPITAL_COMMUNITY): Payer: Self-pay | Admitting: Orthopedic Surgery

## 2013-01-02 ENCOUNTER — Encounter (HOSPITAL_COMMUNITY): Payer: Self-pay

## 2013-01-02 ENCOUNTER — Ambulatory Visit (HOSPITAL_COMMUNITY)
Admission: RE | Admit: 2013-01-02 | Discharge: 2013-01-02 | Disposition: A | Discharge status: Home / Self Care | Payer: Medicare Other | Source: Ambulatory Visit | Attending: Orthopedic Surgery | Admitting: Orthopedic Surgery

## 2013-01-02 ENCOUNTER — Encounter (HOSPITAL_COMMUNITY)
Admission: RE | Admit: 2013-01-02 | Discharge: 2013-01-02 | Disposition: A | Discharge status: Home / Self Care | Payer: Medicare Other | Source: Ambulatory Visit | Attending: Orthopedic Surgery | Admitting: Orthopedic Surgery

## 2013-01-02 DIAGNOSIS — Z01812 Encounter for preprocedural laboratory examination: Secondary | ICD-10-CM | POA: Insufficient documentation

## 2013-01-02 DIAGNOSIS — Z01818 Encounter for other preprocedural examination: Secondary | ICD-10-CM | POA: Insufficient documentation

## 2013-01-02 DIAGNOSIS — I10 Essential (primary) hypertension: Secondary | ICD-10-CM | POA: Insufficient documentation

## 2013-01-02 HISTORY — DX: Unspecified osteoarthritis, unspecified site: M19.90

## 2013-01-02 HISTORY — DX: Gastro-esophageal reflux disease without esophagitis: K21.9

## 2013-01-02 LAB — CBC
HCT: 42.7 % (ref 36.0–46.0)
Hemoglobin: 14.4 g/dL (ref 12.0–15.0)
MCH: 30.8 pg (ref 26.0–34.0)
MCHC: 33.7 g/dL (ref 30.0–36.0)
MCV: 91.2 fL (ref 78.0–100.0)
RBC: 4.68 MIL/uL (ref 3.87–5.11)

## 2013-01-02 LAB — COMPREHENSIVE METABOLIC PANEL
ALT: 88 U/L — ABNORMAL HIGH (ref 0–35)
BUN: 12 mg/dL (ref 6–23)
CO2: 27 mEq/L (ref 19–32)
Calcium: 10 mg/dL (ref 8.4–10.5)
Creatinine, Ser: 0.69 mg/dL (ref 0.50–1.10)
GFR calc Af Amer: >90 mL/min (ref >90)
GFR calc non Af Amer: 88 mL/min — ABNORMAL LOW (ref >90)
Glucose, Bld: 98 mg/dL (ref 70–99)
Total Bilirubin: 0.3 mg/dL (ref 0.3–1.2)

## 2013-01-02 LAB — URINALYSIS, ROUTINE W REFLEX MICROSCOPIC
Bilirubin Urine: NEGATIVE
Glucose, UA: NEGATIVE mg/dL
Ketones, ur: NEGATIVE mg/dL
Leukocytes, UA: NEGATIVE
Nitrite: NEGATIVE
Protein, ur: NEGATIVE mg/dL

## 2013-01-02 LAB — PROTIME-INR: Prothrombin Time: 12.3 seconds (ref 11.6–15.2)

## 2013-01-02 LAB — SURGICAL PCR SCREEN: MRSA, PCR: NEGATIVE

## 2013-01-02 NOTE — Patient Instructions (Addendum)
20 M[address] HIGHT  01/02/2013   Your procedure is scheduled on: 01-15-2013  Report to [facility] at 515  AM.  Call this number if you have problems the morning of surgery [phone]   Remember:   Do not eat food or drink liquids :After Midnight.     Take these medicines the morning of surgery with A SIP OF WATER: methimazole, omeprazole                                SEE [facility] PREPARING FOR SURGERY SHEET             You may not have any metal on your body including hair pins and piercings  Do not wear jewelry, make-up.  Do not wear lotions, powders, or perfumes. You may wear deodorant.   Men may shave face and neck.  Do not bring valuables to the hospital. [facility] IS NOT RESPONSIBLE FOR VALUEABLES.  Contacts, dentures or bridgework may not be worn into surgery.  Leave suitcase in the car. After surgery it may be brought to your room.  For patients admitted to the hospital, checkout time is 11:00 AM the day of discharge.   Patients discharged the day of surgery will not be allowed to drive home.  Name and phone number of your driver:  Special Instructions: N/A   Please read over the following fact sheets that you were given: mrsa information, blood fact sheet, incentive spirometer sheet  Call Cain Sieve RN pre op nurse if needed [phone]    FAILURE TO FOLLOW THESE INSTRUCTIONS MAY RESULT IN THE CANCELLATION OF YOUR SURGERY.  PATIENT SIGNATURE___________________________________________  NURSE SIGNATURE_____________________________________________

## 2013-01-02 NOTE — Progress Notes (Signed)
ekg 05-16-12 epic

## 2013-01-14 ENCOUNTER — Other Ambulatory Visit: Payer: Self-pay | Admitting: Orthopedic Surgery

## 2013-01-14 NOTE — H&P (Signed)
TOTAL KNEE ADMISSION H&P  Patient is being admitted for right total knee arthroplasty.  Subjective:  Chief Complaint:right knee pain.  HPI: Deanna Erickson, 67 y.o. female, has a history of pain and functional disability in the right knee due to arthritis and has failed non-surgical conservative treatments for greater than 12 weeks to includeNSAID's and/or analgesics and corticosteriod injections.  Onset of symptoms was gradual. The patient noted prior procedures on the knee to include  arthroscopy on the bilaterally knee(s).  Patient currently rates pain in the right knee(s) is moderate with activity. Patient has worsening of pain with activity and weight bearing and pain that interferes with activities of daily living.  Patient has evidence of bone on bone findings with larege osteophytes  by imaging studies. This patient has had failure of conservative managment. There is no active infection.  There are no active problems to display for this patient.  Past Medical History  Diagnosis Date  . Hypertension   . H/O renal calculi   . PONV (postoperative nausea and vomiting)   . Hyperthyroidism   . GERD (gastroesophageal reflux disease)   . Arthritis     Past Surgical History  Procedure Laterality Date  . Cholecystectomy    . Bartholin gland cyst excision    . Knee arthroscopy  2000    bilateral  . Plantar fascia surgery      left  . Retinal detachment surgery  dec 2013  . Cataract extraction w/phaco  05/18/2012    Procedure: CATARACT EXTRACTION PHACO AND INTRAOCULAR LENS PLACEMENT (IOC);  Surgeon: Kerry Hunt, MD;  Location: [facility];  Service: Ophthalmology;  Laterality: Right;  CDE: 13.75  . Tonsillectomy    . Tubal ligation    . Surgery right foot      shockwave for plantar fasciatis     (Not in a hospital admission) Allergies  Allergen Reactions  . Ace Inhibitors Cough  . Nsaids     Causes increase in BP.     History  Substance Use Topics  . Smoking status: Never Smoker    . Smokeless tobacco: Never Used  . Alcohol Use: No    No family history on file.   Review of Systems  Constitutional: Negative.   HENT: Negative.   Respiratory: Negative.   Cardiovascular: Negative.   Gastrointestinal: Negative.   Genitourinary: Negative.   Musculoskeletal: Positive for joint pain.  Neurological: Negative.     Objective:  Physical Exam  Constitutional: She is oriented to person, place, and time. She appears well-developed and well-nourished.  HENT:  Head: Normocephalic and atraumatic.  Eyes: EOM are normal. Pupils are equal, round, and reactive to light.  Neck: Neck supple.  Cardiovascular: Normal rate, regular rhythm and normal heart sounds.   Respiratory: Breath sounds normal.  GI: Soft. Bowel sounds are normal.  Musculoskeletal:       Right knee: She exhibits decreased range of motion (5-125). She exhibits no ecchymosis, no erythema, no LCL laxity and no MCL laxity. Tenderness found. Medial joint line and lateral joint line tenderness noted.  Neurological: She is alert and oriented to person, place, and time.    Vital signs in last 24 hours: BP 124/78 Resp 14 Ht 67 inches Wt 179 lbs  Labs:   Estimated body mass index is 27.18 kg/(m^2) as calculated from the following:   Height as of 01/02/13: 5' 7" (1.702 m).   Weight as of 01/02/13: 78.744 kg (173 lb 9.6 oz).   Imaging Review Plain radiographs demonstrate   severe degenerative joint disease of the right knee(s).  The bone quality appears to be good for age and reported activity level.  Assessment/Plan:  End stage arthritis, right knee   The patient history, physical examination, clinical judgment of the provider and imaging studies are consistent with end stage degenerative joint disease of the right knee(s) and total knee arthroplasty is deemed medically necessary. The treatment options including medical management, injection therapy arthroscopy and arthroplasty were discussed at length. The  risks and benefits of total knee arthroplasty were presented and reviewed. The risks due to aseptic loosening, infection, stiffness, patella tracking problems, thromboembolic complications and other imponderables were discussed. The patient acknowledged the explanation, agreed to proceed with the plan and consent was signed. Patient is being admitted for inpatient treatment for surgery, pain control, PT, OT, prophylactic antibiotics, VTE prophylaxis, progressive ambulation and ADL's and discharge planning. The patient is planning to be discharged home with home health services  Plan is for a Right Total Knee Replacement by Dr. Aluisio.  The patient does not have any contraindications and will receive TXA (tranexamic acid) prior to surgery.  PCP - Dr. Brent Burnett   

## 2013-01-15 ENCOUNTER — Encounter (HOSPITAL_COMMUNITY): Payer: Self-pay | Admitting: Anesthesiology

## 2013-01-15 ENCOUNTER — Encounter (HOSPITAL_COMMUNITY): Payer: Self-pay | Admitting: *Deleted

## 2013-01-15 ENCOUNTER — Inpatient Hospital Stay (HOSPITAL_COMMUNITY)
Admission: RE | Admit: 2013-01-15 | Discharge: 2013-01-17 | DRG: 470 | Disposition: A | Discharge status: Home Health | Payer: Medicare Other | Source: Ambulatory Visit | Attending: Orthopedic Surgery | Admitting: Orthopedic Surgery

## 2013-01-15 ENCOUNTER — Encounter (HOSPITAL_COMMUNITY)
Admission: RE | Disposition: A | Discharge status: Home Health | Payer: Self-pay | Source: Ambulatory Visit | Attending: Orthopedic Surgery

## 2013-01-15 ENCOUNTER — Inpatient Hospital Stay (HOSPITAL_COMMUNITY): Payer: Medicare Other | Admitting: Anesthesiology

## 2013-01-15 DIAGNOSIS — I10 Essential (primary) hypertension: Secondary | ICD-10-CM | POA: Diagnosis present

## 2013-01-15 DIAGNOSIS — Z87442 Personal history of urinary calculi: Secondary | ICD-10-CM

## 2013-01-15 DIAGNOSIS — Z96651 Presence of right artificial knee joint: Secondary | ICD-10-CM

## 2013-01-15 DIAGNOSIS — Z6827 Body mass index (BMI) 27.0-27.9, adult: Secondary | ICD-10-CM

## 2013-01-15 DIAGNOSIS — K219 Gastro-esophageal reflux disease without esophagitis: Secondary | ICD-10-CM | POA: Diagnosis present

## 2013-01-15 DIAGNOSIS — M1710 Unilateral primary osteoarthritis, unspecified knee: Principal | ICD-10-CM | POA: Diagnosis present

## 2013-01-15 DIAGNOSIS — E0590 Thyrotoxicosis, unspecified without thyrotoxic crisis or storm: Secondary | ICD-10-CM | POA: Diagnosis present

## 2013-01-15 HISTORY — PX: TOTAL KNEE ARTHROPLASTY: SHX125

## 2013-01-15 LAB — ABO/RH: ABO/RH(D): A POS

## 2013-01-15 LAB — TYPE AND SCREEN: Antibody Screen: NEGATIVE

## 2013-01-15 SURGERY — ARTHROPLASTY, KNEE, TOTAL
Anesthesia: Spinal | Site: Knee | Laterality: Right | Wound class: Clean

## 2013-01-15 MED ORDER — MIDAZOLAM HCL 5 MG/5ML IJ SOLN
INTRAMUSCULAR | Status: DC | PRN
  Administered 2013-01-15 (×2): 1 mg via INTRAVENOUS

## 2013-01-15 MED ORDER — METOCLOPRAMIDE HCL 10 MG PO TABS: 5.0000 mg | ORAL_TABLET | Freq: Three times a day (TID) | ORAL | Status: DC | PRN

## 2013-01-15 MED ORDER — METHIMAZOLE 5 MG PO TABS
5.0000 mg | ORAL_TABLET | ORAL | Status: DC
  Administered 2013-01-16: 09:00:00 5 mg via ORAL
  Filled 2013-01-15: qty 1

## 2013-01-15 MED ORDER — METOPROLOL SUCCINATE ER 50 MG PO TB24
50.0000 mg | ORAL_TABLET | Freq: Every day | ORAL | Status: DC
  Filled 2013-01-15: qty 1

## 2013-01-15 MED ORDER — LACTATED RINGERS IV SOLN
INTRAVENOUS | Status: DC | PRN
  Administered 2013-01-15 (×2): via INTRAVENOUS

## 2013-01-15 MED ORDER — PROPOFOL INFUSION 10 MG/ML OPTIME
INTRAVENOUS | Status: DC | PRN
  Administered 2013-01-15: 100 ug/kg/min via INTRAVENOUS

## 2013-01-15 MED ORDER — FLEET ENEMA 7-19 GM/118ML RE ENEM: 1.0000 | ENEMA | Freq: Once | RECTAL | Status: AC | PRN

## 2013-01-15 MED ORDER — METHIMAZOLE 5 MG PO TABS: 2.5000 mg | ORAL_TABLET | ORAL | Status: DC

## 2013-01-15 MED ORDER — SODIUM CHLORIDE 0.9 % IJ SOLN
INTRAMUSCULAR | Status: AC
  Filled 2013-01-15: qty 50

## 2013-01-15 MED ORDER — BUPIVACAINE IN DEXTROSE 0.75-8.25 % IT SOLN
INTRATHECAL | Status: DC | PRN
  Administered 2013-01-15: 2 mL via INTRATHECAL

## 2013-01-15 MED ORDER — SODIUM CHLORIDE 0.9 % IJ SOLN
INTRAMUSCULAR | Status: DC | PRN
  Administered 2013-01-15: 08:00:00

## 2013-01-15 MED ORDER — MENTHOL 3 MG MT LOZG: 1.0000 | LOZENGE | OROMUCOSAL | Status: DC | PRN

## 2013-01-15 MED ORDER — KETAMINE HCL 10 MG/ML IJ SOLN
INTRAMUSCULAR | Status: DC | PRN
  Administered 2013-01-15 (×2): 10 mg via INTRAVENOUS

## 2013-01-15 MED ORDER — METOPROLOL SUCCINATE ER 25 MG PO TB24
25.0000 mg | ORAL_TABLET | Freq: Every day | ORAL | Status: DC
  Administered 2013-01-15 – 2013-01-16 (×2): 25 mg via ORAL
  Filled 2013-01-15 (×4): qty 1

## 2013-01-15 MED ORDER — PHENYLEPHRINE HCL 10 MG/ML IJ SOLN
INTRAMUSCULAR | Status: DC | PRN
  Administered 2013-01-15 (×5): 80 ug via INTRAVENOUS

## 2013-01-15 MED ORDER — OXYCODONE HCL 5 MG PO TABS: 5.0000 mg | ORAL_TABLET | Freq: Once | ORAL | Status: DC | PRN

## 2013-01-15 MED ORDER — CEFAZOLIN SODIUM 1-5 GM-% IV SOLN
1.0000 g | Freq: Four times a day (QID) | INTRAVENOUS | Status: AC
  Administered 2013-01-15 (×2): 1 g via INTRAVENOUS
  Filled 2013-01-15 (×2): qty 50

## 2013-01-15 MED ORDER — DEXAMETHASONE SODIUM PHOSPHATE 10 MG/ML IJ SOLN
10.0000 mg | Freq: Every day | INTRAMUSCULAR | Status: AC
  Filled 2013-01-15: qty 1

## 2013-01-15 MED ORDER — METOCLOPRAMIDE HCL 5 MG/ML IJ SOLN: 5.0000 mg | Freq: Three times a day (TID) | INTRAMUSCULAR | Status: DC | PRN

## 2013-01-15 MED ORDER — POLYETHYLENE GLYCOL 3350 17 G PO PACK: 17.0000 g | PACK | Freq: Every day | ORAL | Status: DC | PRN

## 2013-01-15 MED ORDER — CEFAZOLIN SODIUM-DEXTROSE 2-3 GM-% IV SOLR
2.0000 g | INTRAVENOUS | Status: AC
  Administered 2013-01-15: 2 g via INTRAVENOUS

## 2013-01-15 MED ORDER — TRAMADOL HCL 50 MG PO TABS: 50.0000 mg | ORAL_TABLET | Freq: Four times a day (QID) | ORAL | Status: DC | PRN

## 2013-01-15 MED ORDER — DEXAMETHASONE 6 MG PO TABS
10.0000 mg | ORAL_TABLET | Freq: Every day | ORAL | Status: AC
  Administered 2013-01-16: 10 mg via ORAL
  Filled 2013-01-15: qty 1

## 2013-01-15 MED ORDER — SODIUM CHLORIDE 0.9 % IV SOLN: INTRAVENOUS | Status: DC

## 2013-01-15 MED ORDER — METHIMAZOLE 5 MG PO TABS
5.0000 mg | ORAL_TABLET | ORAL | Status: DC
  Filled 2013-01-15: qty 1

## 2013-01-15 MED ORDER — METHOCARBAMOL 500 MG PO TABS
500.0000 mg | ORAL_TABLET | Freq: Four times a day (QID) | ORAL | Status: DC | PRN
  Administered 2013-01-15 – 2013-01-17 (×4): 500 mg via ORAL
  Filled 2013-01-15 (×4): qty 1

## 2013-01-15 MED ORDER — HYDROMORPHONE HCL PF 1 MG/ML IJ SOLN
INTRAMUSCULAR | Status: AC
  Filled 2013-01-15: qty 1

## 2013-01-15 MED ORDER — OXYCODONE HCL 5 MG PO TABS
5.0000 mg | ORAL_TABLET | ORAL | Status: DC | PRN
  Administered 2013-01-15: 10 mg via ORAL
  Administered 2013-01-15: 23:00:00 5 mg via ORAL
  Administered 2013-01-15 – 2013-01-16 (×2): 10 mg via ORAL
  Administered 2013-01-16: 5 mg via ORAL
  Administered 2013-01-16 (×2): 10 mg via ORAL
  Administered 2013-01-16 – 2013-01-17 (×2): 5 mg via ORAL
  Filled 2013-01-15: qty 1
  Filled 2013-01-15: qty 2
  Filled 2013-01-15: qty 1
  Filled 2013-01-15 (×2): qty 2
  Filled 2013-01-15: qty 1
  Filled 2013-01-15 (×2): qty 2
  Filled 2013-01-15: qty 1

## 2013-01-15 MED ORDER — HYDROMORPHONE HCL PF 1 MG/ML IJ SOLN
0.2500 mg | INTRAMUSCULAR | Status: DC | PRN
  Administered 2013-01-15: 0.5 mg via INTRAVENOUS

## 2013-01-15 MED ORDER — TRANEXAMIC ACID 100 MG/ML IV SOLN
1000.0000 mg | INTRAVENOUS | Status: AC
  Administered 2013-01-15: 1000 mg via INTRAVENOUS
  Filled 2013-01-15: qty 10

## 2013-01-15 MED ORDER — LACTATED RINGERS IV SOLN: INTRAVENOUS | Status: DC

## 2013-01-15 MED ORDER — BUPIVACAINE HCL (PF) 0.25 % IJ SOLN
INTRAMUSCULAR | Status: AC
  Filled 2013-01-15: qty 30

## 2013-01-15 MED ORDER — MORPHINE SULFATE 2 MG/ML IJ SOLN: 1.0000 mg | INTRAMUSCULAR | Status: DC | PRN

## 2013-01-15 MED ORDER — MEPERIDINE HCL 50 MG/ML IJ SOLN
INTRAMUSCULAR | Status: AC
  Filled 2013-01-15: qty 1

## 2013-01-15 MED ORDER — BUPIVACAINE HCL 0.25 % IJ SOLN
INTRAMUSCULAR | Status: DC | PRN
  Administered 2013-01-15: 20 mL

## 2013-01-15 MED ORDER — ONDANSETRON HCL 4 MG PO TABS
4.0000 mg | ORAL_TABLET | Freq: Four times a day (QID) | ORAL | Status: DC | PRN
  Administered 2013-01-15: 4 mg via ORAL
  Filled 2013-01-15: qty 1

## 2013-01-15 MED ORDER — CEFAZOLIN SODIUM-DEXTROSE 2-3 GM-% IV SOLR
INTRAVENOUS | Status: AC
  Filled 2013-01-15: qty 50

## 2013-01-15 MED ORDER — DOCUSATE SODIUM 100 MG PO CAPS
100.0000 mg | ORAL_CAPSULE | Freq: Two times a day (BID) | ORAL | Status: DC
  Administered 2013-01-15 – 2013-01-17 (×4): 100 mg via ORAL

## 2013-01-15 MED ORDER — OXYCODONE HCL 5 MG/5ML PO SOLN
5.0000 mg | Freq: Once | ORAL | Status: DC | PRN
  Filled 2013-01-15: qty 5

## 2013-01-15 MED ORDER — LORATADINE 10 MG PO TABS
10.0000 mg | ORAL_TABLET | Freq: Every day | ORAL | Status: DC
  Administered 2013-01-15 – 2013-01-16 (×2): 10 mg via ORAL
  Filled 2013-01-15 (×3): qty 1

## 2013-01-15 MED ORDER — PHENOL 1.4 % MT LIQD: 1.0000 | OROMUCOSAL | Status: DC | PRN

## 2013-01-15 MED ORDER — BISACODYL 10 MG RE SUPP: 10.0000 mg | Freq: Every day | RECTAL | Status: DC | PRN

## 2013-01-15 MED ORDER — ONDANSETRON HCL 4 MG/2ML IJ SOLN
INTRAMUSCULAR | Status: DC | PRN
  Administered 2013-01-15: 4 mg via INTRAVENOUS

## 2013-01-15 MED ORDER — PANTOPRAZOLE SODIUM 40 MG PO TBEC
40.0000 mg | DELAYED_RELEASE_TABLET | Freq: Every day | ORAL | Status: DC
  Administered 2013-01-16: 09:00:00 40 mg via ORAL
  Filled 2013-01-15 (×2): qty 1

## 2013-01-15 MED ORDER — MEPERIDINE HCL 50 MG/ML IJ SOLN
6.2500 mg | INTRAMUSCULAR | Status: DC | PRN
  Administered 2013-01-15: 12.5 mg via INTRAVENOUS

## 2013-01-15 MED ORDER — METHIMAZOLE 5 MG PO TABS
2.5000 mg | ORAL_TABLET | ORAL | Status: DC
  Administered 2013-01-17: 10:00:00 2.5 mg via ORAL
  Filled 2013-01-15: qty 1

## 2013-01-15 MED ORDER — RIVAROXABAN 10 MG PO TABS
10.0000 mg | ORAL_TABLET | Freq: Every day | ORAL | Status: DC
  Administered 2013-01-16 – 2013-01-17 (×2): 10 mg via ORAL
  Filled 2013-01-15 (×4): qty 1

## 2013-01-15 MED ORDER — BUPIVACAINE LIPOSOME 1.3 % IJ SUSP
20.0000 mL | Freq: Once | INTRAMUSCULAR | Status: DC
  Filled 2013-01-15 (×2): qty 20

## 2013-01-15 MED ORDER — ACETAMINOPHEN 500 MG PO TABS: 1000.0000 mg | ORAL_TABLET | Freq: Once | ORAL | Status: DC

## 2013-01-15 MED ORDER — FENTANYL CITRATE 0.05 MG/ML IJ SOLN
INTRAMUSCULAR | Status: DC | PRN
  Administered 2013-01-15: 100 ug via INTRAVENOUS

## 2013-01-15 MED ORDER — DIPHENHYDRAMINE HCL 12.5 MG/5ML PO ELIX: 12.5000 mg | ORAL_SOLUTION | ORAL | Status: DC | PRN

## 2013-01-15 MED ORDER — DEXAMETHASONE SODIUM PHOSPHATE 10 MG/ML IJ SOLN
10.0000 mg | Freq: Once | INTRAMUSCULAR | Status: AC
  Administered 2013-01-15: 10 mg via INTRAVENOUS

## 2013-01-15 MED ORDER — IRBESARTAN 150 MG PO TABS
150.0000 mg | ORAL_TABLET | Freq: Every day | ORAL | Status: DC
  Administered 2013-01-16 – 2013-01-17 (×2): 150 mg via ORAL
  Filled 2013-01-15 (×3): qty 1

## 2013-01-15 MED ORDER — ONDANSETRON HCL 4 MG/2ML IJ SOLN: 4.0000 mg | Freq: Four times a day (QID) | INTRAMUSCULAR | Status: DC | PRN

## 2013-01-15 MED ORDER — DEXTROSE-NACL 5-0.9 % IV SOLN
INTRAVENOUS | Status: DC
  Administered 2013-01-15 (×2): via INTRAVENOUS

## 2013-01-15 MED ORDER — METHOCARBAMOL 100 MG/ML IJ SOLN
500.0000 mg | Freq: Four times a day (QID) | INTRAVENOUS | Status: DC | PRN
  Administered 2013-01-15: 500 mg via INTRAVENOUS
  Filled 2013-01-15 (×2): qty 5

## 2013-01-15 MED ORDER — PROMETHAZINE HCL 25 MG/ML IJ SOLN: 6.2500 mg | INTRAMUSCULAR | Status: DC | PRN

## 2013-01-15 SURGICAL SUPPLY — 57 items
BAG SPEC THK2 15X12 ZIP CLS (MISCELLANEOUS) ×1
BAG ZIPLOCK 12X15 (MISCELLANEOUS) ×2 IMPLANT
BANDAGE ELASTIC 6 VELCRO ST LF (GAUZE/BANDAGES/DRESSINGS) ×2 IMPLANT
BANDAGE ESMARK 6X9 LF (GAUZE/BANDAGES/DRESSINGS) ×1 IMPLANT
BLADE SAG 18X100X1.27 (BLADE) ×2 IMPLANT
BLADE SAW SGTL 11.0X1.19X90.0M (BLADE) ×2 IMPLANT
BNDG CMPR 9X6 STRL LF SNTH (GAUZE/BANDAGES/DRESSINGS) ×1
BNDG ESMARK 6X9 LF (GAUZE/BANDAGES/DRESSINGS) ×2
BOWL SMART MIX CTS (DISPOSABLE) ×2 IMPLANT
CAPT RP KNEE ×1 IMPLANT
CEMENT HV SMART SET (Cement) ×3 IMPLANT
CLOTH BEACON ORANGE TIMEOUT ST (SAFETY) ×2 IMPLANT
CUFF TOURN SGL QUICK 34 (TOURNIQUET CUFF) ×2
CUFF TRNQT CYL 34X4X40X1 (TOURNIQUET CUFF) ×1 IMPLANT
DECANTER SPIKE VIAL GLASS SM (MISCELLANEOUS) ×2 IMPLANT
DRAPE EXTREMITY T 121X128X90 (DRAPE) ×2 IMPLANT
DRAPE POUCH INSTRU U-SHP 10X18 (DRAPES) ×2 IMPLANT
DRAPE U-SHAPE 47X51 STRL (DRAPES) ×2 IMPLANT
DRSG ADAPTIC 3X8 NADH LF (GAUZE/BANDAGES/DRESSINGS) ×2 IMPLANT
DRSG PAD ABDOMINAL 8X10 ST (GAUZE/BANDAGES/DRESSINGS) ×2 IMPLANT
DURAPREP 26ML APPLICATOR (WOUND CARE) ×2 IMPLANT
ELECT REM PT RETURN 9FT ADLT (ELECTROSURGICAL) ×2
ELECTRODE REM PT RTRN 9FT ADLT (ELECTROSURGICAL) ×1 IMPLANT
EVACUATOR 1/8 PVC DRAIN (DRAIN) ×2 IMPLANT
FACESHIELD LNG OPTICON STERILE (SAFETY) ×10 IMPLANT
GLOVE BIO SURGEON STRL SZ7.5 (GLOVE) IMPLANT
GLOVE BIO SURGEON STRL SZ8 (GLOVE) ×2 IMPLANT
GLOVE BIOGEL PI IND STRL 8 (GLOVE) ×2 IMPLANT
GLOVE BIOGEL PI INDICATOR 8 (GLOVE) ×2
GLOVE SURG SS PI 6.5 STRL IVOR (GLOVE) IMPLANT
GOWN PREVENTION PLUS LG XLONG (DISPOSABLE) ×2 IMPLANT
GOWN STRL REIN XL XLG (GOWN DISPOSABLE) ×3 IMPLANT
HANDPIECE INTERPULSE COAX TIP (DISPOSABLE) ×2
IMMOBILIZER KNEE 20 (SOFTGOODS) ×2
IMMOBILIZER KNEE 20 THIGH 36 (SOFTGOODS) ×1 IMPLANT
KIT BASIN OR (CUSTOM PROCEDURE TRAY) ×2 IMPLANT
MANIFOLD NEPTUNE II (INSTRUMENTS) ×2 IMPLANT
NDL SAFETY ECLIPSE 18X1.5 (NEEDLE) ×2 IMPLANT
NEEDLE HYPO 18GX1.5 SHARP (NEEDLE) ×4
NS IRRIG 1000ML POUR BTL (IV SOLUTION) ×2 IMPLANT
PACK TOTAL JOINT (CUSTOM PROCEDURE TRAY) ×2 IMPLANT
PADDING CAST COTTON 6X4 STRL (CAST SUPPLIES) ×6 IMPLANT
POSITIONER SURGICAL ARM (MISCELLANEOUS) ×2 IMPLANT
SET HNDPC FAN SPRY TIP SCT (DISPOSABLE) ×1 IMPLANT
SPONGE GAUZE 4X4 12PLY (GAUZE/BANDAGES/DRESSINGS) ×2 IMPLANT
STRIP CLOSURE SKIN 1/2X4 (GAUZE/BANDAGES/DRESSINGS) ×4 IMPLANT
SUCTION FRAZIER 12FR DISP (SUCTIONS) ×2 IMPLANT
SUT MNCRL AB 4-0 PS2 18 (SUTURE) ×2 IMPLANT
SUT VIC AB 2-0 CT1 27 (SUTURE) ×6
SUT VIC AB 2-0 CT1 TAPERPNT 27 (SUTURE) ×3 IMPLANT
SUT VLOC 180 0 24IN GS25 (SUTURE) ×1 IMPLANT
SYR 20CC LL (SYRINGE) ×2 IMPLANT
SYR 50ML LL SCALE MARK (SYRINGE) ×2 IMPLANT
TOWEL OR 17X26 10 PK STRL BLUE (TOWEL DISPOSABLE) ×4 IMPLANT
TRAY FOLEY CATH 14FRSI W/METER (CATHETERS) ×2 IMPLANT
WATER STERILE IRR 1500ML POUR (IV SOLUTION) ×2 IMPLANT
WRAP KNEE MAXI GEL POST OP (GAUZE/BANDAGES/DRESSINGS) ×2 IMPLANT

## 2013-01-15 NOTE — Op Note (Addendum)
Pre-operative diagnosis- Osteoarthritis  Right knee(s)  Post-operative diagnosis- Osteoarthritis Right knee(s)  Procedure-  Right  Total Knee Arthroplasty  Surgeon- Gus Rankin. Dewaun Kinzler, MD  Assistant- Dimitri Ped, PA-C  Anesthesia-  Spinal EBL-* No blood loss amount entered *  Drains Hemovac  Tourniquet time-  331 minutes @ 300 mm Hg  Complications- None  Condition-PACU - hemodynamically stable.   Brief Clinical Note  Deanna Erickson is a 67 y.o. year old female with end stage OA of her right knee with progressively worsening pain and dysfunction. She has constant pain, with activity and at rest and significant functional deficits with difficulties even with ADLs. She has had extensive non-op management including analgesics, injections of cortisone and viscosupplements, and home exercise program, but remains in significant pain with significant dysfunction.Radiographs show bone on bone arthritis lateral and patellofemoral. She presents now for right Total Knee Arthroplasty.    Procedure in detail---   The patient is brought into the operating room and positioned supine on the operating table. After successful administration of  Spinal,   a tourniquet is placed high on the  Right thigh(s) and the lower extremity is prepped and draped in the usual sterile fashion. Time out is performed by the operating team and then the  Right lower extremity is wrapped in Esmarch, knee flexed and the tourniquet inflated to 300 mmHg.       A midline incision is made with a ten blade through the subcutaneous tissue to the level of the extensor mechanism. A fresh blade is used to make a medial parapatellar arthrotomy. Soft tissue over the proximal medial tibia is subperiosteally elevated to the joint line with a knife and into the semimembranosus bursa with a Cobb elevator. Soft tissue over the proximal lateral tibia is elevated with attention being paid to avoiding the patellar tendon on the tibial tubercle.  The patella is everted, knee flexed 90 degrees and the ACL and PCL are removed. Findings are bone on bone lateral and patellofemoral with large global osteophytes.        The drill is used to create a starting hole in the distal femur and the canal is thoroughly irrigated with sterile saline to remove the fatty contents. The 5 degree Right  valgus alignment guide is placed into the femoral canal and the distal femoral cutting block is pinned to remove 10 mm off the distal femur. Resection is made with an oscillating saw.      The tibia is subluxed forward and the menisci are removed. The extramedullary alignment guide is placed referencing proximally at the medial aspect of the tibial tubercle and distally along the second metatarsal axis and tibial crest. The block is pinned to remove 2mm off the more deficient lateral  side. Resection is made with an oscillating saw. Size 3is the most appropriate size for the tibia and the proximal tibia is prepared with the modular drill and keel punch for that size.      The femoral sizing guide is placed and size 4 narrow is most appropriate. Rotation is marked off the epicondylar axis and confirmed by creating a rectangular flexion gap at 90 degrees. The size 4 cutting block is pinned in this rotation and the anterior, posterior and chamfer cuts are made with the oscillating saw. The intercondylar block is then placed and that cut is made.      Trial size 3 tibial component, trial size 4 narrow posterior stabilized femur and a 15  mm posterior stabilized rotating  platform insert trial is placed. Full extension is achieved with excellent varus/valgus and anterior/posterior balance throughout full range of motion. The patella is everted and thickness measured to be 24  mm. Free hand resection is taken to 14 mm, a 35 template is placed, lug holes are drilled, trial patella is placed, and it tracks normally. Osteophytes are removed off the posterior femur with the trial in  place. All trials are removed and the cut bone surfaces prepared with pulsatile lavage. Cement is mixed and once ready for implantation, the size 3 tibial implant, size  4 narrow posterior stabilized femoral component, and the size 35 patella are cemented in place and the patella is held with the clamp. The trial insert is placed and the knee held in full extension. The Exparel (20 ml mixed with 30 ml saline) and .25% Bupivicaine, are injected into the extensor mechanism, posterior capsule, medial and lateral gutters and subcutaneous tissues.  All extruded cement is removed and once the cement is hard the permanent 15 mm posterior stabilized rotating platform insert is placed into the tibial tray.      The wound is copiously irrigated with saline solution and the extensor mechanism closed over a hemovac drain with #1 PDS suture. The tourniquet is released for a total tourniquet time of 31  minutes. Flexion against gravity is 140 degrees and the patella tracks normally. Subcutaneous tissue is closed with 2.0 vicryl and subcuticular with running 4.0 Monocryl. The incision is cleaned and dried and steri-strips and a bulky sterile dressing are applied. The limb is placed into a knee immobilizer and the patient is awakened and transported to recovery in stable condition.      Please note that a surgical assistant was a medical necessity for this procedure in order to perform it in a safe and expeditious manner. Surgical assistant was necessary to retract the ligaments and vital neurovascular structures to prevent injury to them and also necessary for proper positioning of the limb to allow for anatomic placement of the prosthesis.   Gus Rankin Anelle Parlow, MD    01/15/2013, 8:06 AM

## 2013-01-15 NOTE — Transfer of Care (Signed)
Immediate Anesthesia Transfer of Care Note  Patient: Deanna Erickson  Procedure(s) Performed: Procedure(s): RIGHT TOTAL KNEE ARTHROPLASTY (Right)  Patient Location: PACU  Anesthesia Type:Spinal  Level of Consciousness: awake, alert , oriented and patient cooperative  Airway & Oxygen Therapy: Patient Spontanous Breathing and Patient connected to face mask oxygen  Post-op Assessment: Report given to PACU RN, Post -op Vital signs reviewed and stable and SAB T11.  Post vital signs: Reviewed and stable  Complications: No apparent anesthesia complications

## 2013-01-15 NOTE — Progress Notes (Signed)
Utilization review completed.  

## 2013-01-15 NOTE — Anesthesia Postprocedure Evaluation (Signed)
Anesthesia Post Note  Patient: Deanna Erickson  Procedure(s) Performed: Procedure(s) (LRB): RIGHT TOTAL KNEE ARTHROPLASTY (Right)  Anesthesia type: Spinal  Patient location: PACU  Post pain: Pain level controlled  Post assessment: Post-op Vital signs reviewed  Last Vitals: BP 142/69  Pulse 63  Temp(Src) 36 C (Oral)  Resp 11  Ht 5\' 7"  (1.702 m)  Wt 173 lb (78.472 kg)  BMI 27.09 kg/m2  SpO2 100%  Post vital signs: Reviewed  Level of consciousness: sedated  Complications: No apparent anesthesia complications

## 2013-01-15 NOTE — Interval H&P Note (Signed)
History and Physical Interval Note:  01/15/2013 7:00 AM  Deanna Erickson  has presented today for surgery, with the diagnosis of OA RIGHT KNEE   The various methods of treatment have been discussed with the patient and family. After consideration of risks, benefits and other options for treatment, the patient has consented to  Procedure(s): RIGHT TOTAL KNEE ARTHROPLASTY (Right) as a surgical intervention .  The patient's history has been reviewed, patient examined, no change in status, stable for surgery.  I have reviewed the patient's chart and labs.  Questions were answered to the patient's satisfaction.     Loanne Drilling

## 2013-01-15 NOTE — H&P (View-Only) (Signed)
TOTAL KNEE ADMISSION H&P  Patient is being admitted for right total knee arthroplasty.  Subjective:  Chief Complaint:right knee pain.  HPI: Deanna Erickson, 67 y.o. female, has a history of pain and functional disability in the right knee due to arthritis and has failed non-surgical conservative treatments for greater than 12 weeks to includeNSAID's and/or analgesics and corticosteriod injections.  Onset of symptoms was gradual. The patient noted prior procedures on the knee to include  arthroscopy on the bilaterally knee(s).  Patient currently rates pain in the right knee(s) is moderate with activity. Patient has worsening of pain with activity and weight bearing and pain that interferes with activities of daily living.  Patient has evidence of bone on bone findings with larege osteophytes  by imaging studies. This patient has had failure of conservative managment. There is no active infection.  There are no active problems to display for this patient.  Past Medical History  Diagnosis Date  . Hypertension   . H/O renal calculi   . PONV (postoperative nausea and vomiting)   . Hyperthyroidism   . GERD (gastroesophageal reflux disease)   . Arthritis     Past Surgical History  Procedure Laterality Date  . Cholecystectomy    . Bartholin gland cyst excision    . Knee arthroscopy  2000    bilateral  . Plantar fascia surgery      left  . Retinal detachment surgery  dec 2013  . Cataract extraction w/phaco  05/18/2012    Procedure: CATARACT EXTRACTION PHACO AND INTRAOCULAR LENS PLACEMENT (IOC);  Surgeon: Gemma Payor, MD;  Location: [facility];  Service: Ophthalmology;  Laterality: Right;  CDE: 13.75  . Tonsillectomy    . Tubal ligation    . Surgery right foot      shockwave for plantar fasciatis     (Not in a hospital admission) Allergies  Allergen Reactions  . Ace Inhibitors Cough  . Nsaids     Causes increase in BP.     History  Substance Use Topics  . Smoking status: Never Smoker    . Smokeless tobacco: Never Used  . Alcohol Use: No    No family history on file.   Review of Systems  Constitutional: Negative.   HENT: Negative.   Respiratory: Negative.   Cardiovascular: Negative.   Gastrointestinal: Negative.   Genitourinary: Negative.   Musculoskeletal: Positive for joint pain.  Neurological: Negative.     Objective:  Physical Exam  Constitutional: She is oriented to person, place, and time. She appears well-developed and well-nourished.  HENT:  Head: Normocephalic and atraumatic.  Eyes: EOM are normal. Pupils are equal, round, and reactive to light.  Neck: Neck supple.  Cardiovascular: Normal rate, regular rhythm and normal heart sounds.   Respiratory: Breath sounds normal.  GI: Soft. Bowel sounds are normal.  Musculoskeletal:       Right knee: She exhibits decreased range of motion (5-125). She exhibits no ecchymosis, no erythema, no LCL laxity and no MCL laxity. Tenderness found. Medial joint line and lateral joint line tenderness noted.  Neurological: She is alert and oriented to person, place, and time.    Vital signs in last 24 hours: BP 124/78 Resp 14 Ht 67 inches Wt 179 lbs  Labs:   Estimated body mass index is 27.18 kg/(m^2) as calculated from the following:   Height as of 01/02/13: 5\' 7"  (1.702 m).   Weight as of 01/02/13: 78.744 kg (173 lb 9.6 oz).   Imaging Review Plain radiographs demonstrate  severe degenerative joint disease of the right knee(s).  The bone quality appears to be good for age and reported activity level.  Assessment/Plan:  End stage arthritis, right knee   The patient history, physical examination, clinical judgment of the provider and imaging studies are consistent with end stage degenerative joint disease of the right knee(s) and total knee arthroplasty is deemed medically necessary. The treatment options including medical management, injection therapy arthroscopy and arthroplasty were discussed at length. The  risks and benefits of total knee arthroplasty were presented and reviewed. The risks due to aseptic loosening, infection, stiffness, patella tracking problems, thromboembolic complications and other imponderables were discussed. The patient acknowledged the explanation, agreed to proceed with the plan and consent was signed. Patient is being admitted for inpatient treatment for surgery, pain control, PT, OT, prophylactic antibiotics, VTE prophylaxis, progressive ambulation and ADL's and discharge planning. The patient is planning to be discharged home with home health services  Plan is for a Right Total Knee Replacement by Dr. Lequita Halt.  The patient does not have any contraindications and will receive TXA (tranexamic acid) prior to surgery.  PCP - Dr. Marjory Lies

## 2013-01-15 NOTE — Anesthesia Preprocedure Evaluation (Addendum)
Anesthesia Evaluation  Patient identified by MRN, date of birth, ID band Patient awake    Reviewed: Allergy & Precautions, H&P , NPO status , Patient's Chart, lab work & pertinent test results  History of Anesthesia Complications (+) PONV  Airway Mallampati: II TM Distance: >3 FB Neck ROM: Full    Dental  (+) Teeth Intact and Dental Advisory Given   Pulmonary neg pulmonary ROS,  breath sounds clear to auscultation        Cardiovascular hypertension, Pt. on medications Rhythm:Regular Rate:Normal     Neuro/Psych negative neurological ROS  negative psych ROS   GI/Hepatic Neg liver ROS, GERD-  Controlled and Medicated,  Endo/Other  Hyperthyroidism   Renal/GU negative Renal ROS     Musculoskeletal negative musculoskeletal ROS (+)   Abdominal   Peds  Hematology negative hematology ROS (+)   Anesthesia Other Findings   Reproductive/Obstetrics negative OB ROS                          Anesthesia Physical  Anesthesia Plan  ASA: II  Anesthesia Plan: Spinal   Post-op Pain Management:    Induction:   Airway Management Planned:   Additional Equipment:   Intra-op Plan:   Post-operative Plan:   Informed Consent: I have reviewed the patients History and Physical, chart, labs and discussed the procedure including the risks, benefits and alternatives for the proposed anesthesia with the patient or authorized representative who has indicated his/her understanding and acceptance.   Dental advisory given  Plan Discussed with: CRNA  Anesthesia Plan Comments:        Anesthesia Quick Evaluation

## 2013-01-15 NOTE — Evaluation (Signed)
Physical Therapy Evaluation Patient Details Name: Deanna Erickson MRN: [id number] DOB: [birth year] Today's Date: 01/15/2013 Time: 1445-1520 PT Time Calculation (min): 35 min  PT Assessment / Plan / Recommendation History of Present Illness  R TKA  Clinical Impression  Pt will benefit from PT to address deficits below; Plan is for home with husband and HHPT    PT Assessment  Patient needs continued PT services    Follow Up Recommendations  Home health PT    Does the patient have the potential to tolerate intense rehabilitation      Barriers to Discharge        Equipment Recommendations  Rolling walker with 5" wheels    Recommendations for Other Services     Frequency 7X/week    Precautions / Restrictions Precautions Precautions: Knee Required Braces or Orthoses: Knee Immobilizer - Right Restrictions Other Position/Activity Restrictions: WBAT   Pertinent Vitals/Pain Just "achy" right knee      Mobility  Bed Mobility Bed Mobility: Supine to Sit Supine to Sit: 4: Min assist Details for Bed Mobility Assistance: cues for technique Transfers Transfers: Sit to Stand;Stand to Sit Sit to Stand: 4: Min assist;4: Min guard Stand to Sit: 4: Min assist;4: Min guard Details for Transfer Assistance: cues for hand placement Ambulation/Gait Ambulation/Gait Assistance: 4: Min Environmental consultant (Feet): 38 Feet Assistive device: Rolling walker Gait Pattern: Step-to pattern;Antalgic Gait velocity: decr    Exercises Total Joint Exercises Ankle Circles/Pumps: AROM;Both;5 reps Quad Sets: AROM;Both;5 reps   PT Diagnosis: Difficulty walking  PT Problem List: Decreased strength;Decreased range of motion;Decreased activity tolerance;Decreased mobility;Decreased knowledge of use of DME;Decreased knowledge of precautions PT Treatment Interventions: DME instruction;Gait training;Stair training;Functional mobility training;Therapeutic activities;Therapeutic  exercise;Patient/family education     PT Goals(Current goals can be found in the care plan section) Acute Rehab PT Goals Patient Stated Goal: home PT Goal Formulation: With patient Time For Goal Achievement: 01/19/13 Potential to Achieve Goals: Good  Visit Information  Last PT Received On: 01/15/13 Assistance Needed: +1 History of Present Illness: R TKA       Prior Functioning  Home Living Family/patient expects to be discharged to:: Private residence Living Arrangements: Spouse/significant other Type of Home: House Home Access: Stairs to enter Secretary/administrator of Steps: 1-2 Entrance Stairs-Rails: None Home Layout: One level Alternate Level Stairs-Number of Steps: with basement, doesn't have to get to  Home Equipment: Walker - 2 wheels;Bedside commode Prior Function Level of Independence: Independent Comments: pt is very active, exercises and swims Communication Communication: No difficulties    Cognition  Cognition Arousal/Alertness: Awake/alert Behavior During Therapy: WFL for tasks assessed/performed Overall Cognitive Status: Within Functional Limits for tasks assessed    Extremity/Trunk Assessment Upper Extremity Assessment Upper Extremity Assessment: Overall WFL for tasks assessed Lower Extremity Assessment Lower Extremity Assessment: RLE deficits/detail RLE Deficits / Details: ankle WFL; able to assist with SLR   Balance    End of Session PT - End of Session Equipment Utilized During Treatment: Gait belt;Right knee immobilizer Activity Tolerance: Patient tolerated treatment well Patient left: in chair;with call bell/phone within reach;with family/visitor present CPM Right Knee CPM Right Knee: Off  GP     [facility] 01/15/2013, 3:23 PM

## 2013-01-15 NOTE — Anesthesia Procedure Notes (Signed)
Spinal  Patient location during procedure: OR Start time: 01/15/2013 7:09 AM End time: 01/15/2013 7:11 AM Staffing Anesthesiologist: Lewie Loron R Performed by: anesthesiologist  Preanesthetic Checklist Completed: patient identified, site marked, surgical consent, pre-op evaluation, timeout performed, IV checked, risks and benefits discussed and monitors and equipment checked Spinal Block Patient position: sitting Prep: ChloraPrep and site prepped and draped Patient monitoring: heart rate, continuous pulse ox and blood pressure Approach: midline Location: L2-3 Injection technique: single-shot Needle Needle type: Quincke  Needle gauge: 22 G Needle length: 9 cm Assessment Sensory level: T8 Additional Notes Expiration date of kit checked and confirmed. Patient tolerated procedure well, without complications.

## 2013-01-16 LAB — BASIC METABOLIC PANEL
Chloride: 100 mEq/L (ref 96–112)
GFR calc Af Amer: >90 mL/min (ref >90)
GFR calc non Af Amer: >90 mL/min (ref >90)
Glucose, Bld: 157 mg/dL — ABNORMAL HIGH (ref 70–99)
Potassium: 3.8 mEq/L (ref 3.5–5.1)
Sodium: 132 mEq/L — ABNORMAL LOW (ref 135–145)

## 2013-01-16 LAB — CBC
Hemoglobin: 11.9 g/dL — ABNORMAL LOW (ref 12.0–15.0)
MCHC: 34.1 g/dL (ref 30.0–36.0)
Platelets: 263 10*3/uL (ref 150–400)
RBC: 3.86 MIL/uL — ABNORMAL LOW (ref 3.87–5.11)
WBC: 12.5 10*3/uL — ABNORMAL HIGH (ref 4.0–10.5)

## 2013-01-16 NOTE — Care Management Note (Signed)
    Page 1 of 1   01/17/2013     5:35:15 PM   CARE MANAGEMENT NOTE 01/17/2013  Patient:  Deanna Erickson, Deanna Erickson   Account Number:  [id number]  Date Initiated:  01/16/2013  Documentation initiated by:  Colleen Can  Subjective/Objective Assessment:   dx rt knee replacemnt     Action/Plan:   CM spoke with patient. Plans are for her to return to her home in [place] where spouse will be caregiver. She wants [facility] for [facility] services and is requesting specific therapist. Already has DME   Anticipated DC Date:  01/17/2013   Anticipated DC Plan:  HOME W HOME HEALTH SERVICES      DC Planning Services  CM consult      [facility] Choice  HOME HEALTH   Choice offered to / List presented to:  C-1 Patient        HH arranged  HH-2 PT      [facility] agency  [facility]   Status of service:  Completed, signed off Medicare Important Message given?  NA - LOS <3 / Initial given by admissions (If response is "NO", the following Medicare IM given date fields will be blank) Date Medicare IM given:   Date Additional Medicare IM given:    Discharge Disposition:  HOME W HOME HEALTH SERVICES  Per UR Regulation:    If discussed at Long Length of Stay Meetings, dates discussed:    Comments:  01/17/2013 Colleen Can BSN RN  CCM [phone] [facility] will start services tomorrow 01/18/2013.

## 2013-01-16 NOTE — Progress Notes (Signed)
Physical Therapy Treatment Patient Details Name: Deanna Erickson MRN: [id number] DOB: 1946-04-03 Today's Date: 01/16/2013 Time: 4098-1191 PT Time Calculation (min): 29 min  PT Assessment / Plan / Recommendation  History of Present Illness R TKA   PT Comments   POD # 1 pm session.  Assisted out of recliner to amb to BR to void then amb in hallway.  Assisted back to bed for CPM.   Follow Up Recommendations  Home health PT     Does the patient have the potential to tolerate intense rehabilitation     Barriers to Discharge        Equipment Recommendations  Rolling walker with 5" wheels    Recommendations for Other Services    Frequency 7X/week   Progress towards PT Goals Progress towards PT goals: Progressing toward goals  Plan      Precautions / Restrictions Precautions Precautions: Knee;Fall Precaution Comments: Instructed pt on KI use for amb Required Braces or Orthoses: Knee Immobilizer - Right Restrictions Weight Bearing Restrictions: No Other Position/Activity Restrictions: WBAT    Pertinent Vitals/Pain C/o 3/10 pain     Mobility  Bed Mobility Bed Mobility: Sit to Supine Sit to Supine: 4: Min guard Details for Bed Mobility Assistance: min guard assist to get back into bed Transfers Transfers: Sit to Stand;Stand to Sit Sit to Stand: 4: Min guard;5: Supervision Stand to Sit: 5: Supervision;4: Min guard Details for Transfer Assistance: cues for hand placement Ambulation/Gait Ambulation/Gait Assistance: 4: Min guard;5: Supervision Ambulation Distance (Feet): 110 Feet Assistive device: Rolling walker Ambulation/Gait Assistance Details: increased time and one VC on safety Gait Pattern: Step-to pattern;Step-through pattern Gait velocity: decr    PT Goals (current goals can now be found in the care plan section)    Visit Information  Last PT Received On: 01/16/13 Assistance Needed: +1 History of Present Illness: R TKA    Subjective Data      Cognition       Balance     End of Session PT - End of Session Equipment Utilized During Treatment: Gait belt Activity Tolerance: Patient tolerated treatment well Patient left: in bed;with call bell/phone within reach;with family/visitor present CPM Right Knee CPM Right Knee: On   Felecia Shelling  PTA [facility]  Acute  Rehab Pager      [phone]

## 2013-01-16 NOTE — Progress Notes (Signed)
Physical Therapy Treatment Patient Details Name: Deanna Erickson MRN: [id number] DOB: 09/30/1945 Today's Date: 01/16/2013 Time: 4098-1191 PT Time Calculation (min): 17 min  PT Assessment / Plan / Recommendation  History of Present Illness R TKA   PT Comments   Pt progressing well  Follow Up Recommendations  Home health PT     Does the patient have the potential to tolerate intense rehabilitation     Barriers to Discharge        Equipment Recommendations  Rolling walker with 5" wheels    Recommendations for Other Services    Frequency 7X/week   Progress towards PT Goals Progress towards PT goals: Progressing toward goals  Plan Current plan remains appropriate    Precautions / Restrictions Precautions Precautions: Knee Precaution Comments: I SLR Required Braces or Orthoses: Knee Immobilizer - Right Restrictions Weight Bearing Restrictions: No Other Position/Activity Restrictions: WBAT   Pertinent Vitals/Pain "ok"--pain    Mobility  Bed Mobility Bed Mobility: Sit to Supine Sit to Supine: 5: Supervision Details for Bed Mobility Assistance: cues for technique Transfers Transfers: Sit to Stand;Stand to Sit Sit to Stand: 5: Supervision Stand to Sit: 5: Supervision Details for Transfer Assistance: cues for hand placement Ambulation/Gait Ambulation/Gait Assistance: 5: Supervision Ambulation Distance (Feet): 100 Feet Assistive device: Rolling walker Ambulation/Gait Assistance Details: cues for step through Gait Pattern: Step-to pattern;Step-through pattern Gait velocity: decr    Exercises Total Joint Exercises Ankle Circles/Pumps: AROM;Both;10 reps Quad Sets: AROM;Both;10 reps Heel Slides: AROM;AAROM;Right;10 reps Hip ABduction/ADduction: AROM;AAROM;Right;10 reps Straight Leg Raises: AROM;Right;10 reps   PT Diagnosis:    PT Problem List:   PT Treatment Interventions:     PT Goals (current goals can now be found in the care plan section) Acute Rehab PT  Goals Patient Stated Goal: home Time For Goal Achievement: 01/19/13 Potential to Achieve Goals: Good  Visit Information  Last PT Received On: 01/16/13 Assistance Needed: +1 History of Present Illness: R TKA    Subjective Data  Patient Stated Goal: home   Cognition  Cognition Arousal/Alertness: Awake/alert Behavior During Therapy: WFL for tasks assessed/performed Overall Cognitive Status: Within Functional Limits for tasks assessed    Balance     End of Session PT - End of Session Equipment Utilized During Treatment: Gait belt Activity Tolerance: Patient tolerated treatment well Patient left: in bed;with call bell/phone within reach;with family/visitor present Nurse Communication: Mobility status   GP     [facility] 01/16/2013, 11:55 AM

## 2013-01-16 NOTE — Evaluation (Signed)
Occupational Therapy Evaluation Patient Details Name: Deanna Erickson MRN: [id number] DOB: [birth year] Today's Date: 01/16/2013 Time: 4098-1191 OT Time Calculation (min): 21 min  OT Assessment / Plan / Recommendation History of present illness R TKA   Clinical Impression   Pt doing well. Began education on toilet transfers and pt tolerated well. Will see likely one more visit prior to d/c to address shower transfer safety.    OT Assessment  Patient needs continued OT Services    Follow Up Recommendations  No OT follow up;Supervision/Assistance - 24 hour    Barriers to Discharge      Equipment Recommendations  None recommended by OT    Recommendations for Other Services    Frequency  Min 2X/week    Precautions / Restrictions Precautions Precautions: Knee Required Braces or Orthoses: Knee Immobilizer - Right Restrictions Weight Bearing Restrictions: No Other Position/Activity Restrictions: WBAT   Pertinent Vitals/Pain 5/10 R knee; ice; informed nursing.    ADL  Eating/Feeding: Independent Where Assessed - Eating/Feeding: Chair Grooming: Wash/dry hands;Min guard Where Assessed - Grooming: Unsupported standing Upper Body Bathing: Chest;Left arm;Right arm;Abdomen;Set up Where Assessed - Upper Body Bathing: Unsupported sitting Lower Body Bathing: Minimal assistance Where Assessed - Lower Body Bathing: Supported sit to stand Upper Body Dressing: Set up Where Assessed - Upper Body Dressing: Unsupported sitting Lower Body Dressing: Minimal assistance;Moderate assistance Where Assessed - Lower Body Dressing: Supported sit to stand Toilet Transfer: Lobbyist: Raised toilet seat with arms (or 3-in-1 over toilet) Toileting - Clothing Manipulation and Hygiene: Min guard Where Assessed - Engineer, mining and Hygiene: Sit to stand from 3-in-1 or toilet Equipment Used: Rolling walker ADL Comments: Pt states husband can assist with LB ADL at  d/c.    OT Diagnosis: Generalized weakness  OT Problem List: Decreased strength;Decreased knowledge of use of DME or AE OT Treatment Interventions: Self-care/ADL training;DME and/or AE instruction;Therapeutic activities;Patient/family education   OT Goals(Current goals can be found in the care plan section) Acute Rehab OT Goals Patient Stated Goal: home OT Goal Formulation: With patient Time For Goal Achievement: 01/23/13 Potential to Achieve Goals: Good  Visit Information  Last OT Received On: 01/16/13 Assistance Needed: +1 History of Present Illness: R TKA       Prior Functioning     Home Living Family/patient expects to be discharged to:: Private residence Living Arrangements: Spouse/significant other Type of Home: House Home Access: Stairs to enter Secretary/administrator of Steps: 1-2 Entrance Stairs-Rails: None Home Layout: One level Alternate Level Stairs-Number of Steps: with basement, doesn't have to get to  Home Equipment: Walker - 2 wheels;Bedside commode Prior Function Level of Independence: Independent Communication Communication: No difficulties         Vision/Perception     Cognition  Cognition Arousal/Alertness: Awake/alert Behavior During Therapy: WFL for tasks assessed/performed Overall Cognitive Status: Within Functional Limits for tasks assessed    Extremity/Trunk Assessment Upper Extremity Assessment Upper Extremity Assessment: Overall WFL for tasks assessed     Mobility Transfers Transfers: Sit to Stand;Stand to Sit Sit to Stand: 4: Min guard;With upper extremity assist;From chair/3-in-1 Stand to Sit: 4: Min guard;With upper extremity assist;To chair/3-in-1 Details for Transfer Assistance: cues for hand placement     Exercise     Balance     End of Session OT - End of Session Equipment Utilized During Treatment: Rolling walker Activity Tolerance: Patient tolerated treatment well Patient left: in chair;with call bell/phone  within reach CPM Right Knee CPM Right Knee: Off  GO     Lennox Laity [phone] 01/16/2013, 9:19 AM

## 2013-01-16 NOTE — Progress Notes (Signed)
   Subjective: 1 Day Post-Op Procedure(s) (LRB): RIGHT TOTAL KNEE ARTHROPLASTY (Right) Patient reports pain as mild.   Patient seen in rounds for Dr. Lequita Halt. Patient is well, and has had no acute complaints or problems We will start therapy today.  Plan is to go Home after hospital stay.  Objective: Vital signs in last 24 hours: Temp:  [96.6 F (35.9 C)-98.9 F (37.2 C)] 97.9 F (36.6 C) (10/07 1100) Pulse Rate:  [74-91] 82 (10/07 1100) Resp:  [15-18] 18 (10/07 1156) BP: (132-169)/(71-88) 169/88 mmHg (10/07 1100) SpO2:  [95 %-97 %] 95 % (10/07 1100)  Intake/Output from previous day:  Intake/Output Summary (Last 24 hours) at 01/16/13 1237 Last data filed at 01/16/13 1000  Gross per 24 hour  Intake 2502.5 ml  Output   2660 ml  Net -157.5 ml    Intake/Output this shift: Total I/O In: 540 [P.O.:240; I.V.:300] Out: 150 [Urine:150]  Labs:  Recent Labs  01/16/13 0405  HGB 11.9*    Recent Labs  01/16/13 0405  WBC 12.5*  RBC 3.86*  HCT 34.9*  PLT 263    Recent Labs  01/16/13 0405  NA 132*  K 3.8  CL 100  CO2 23  BUN 8  CREATININE 0.58  GLUCOSE 157*  CALCIUM 8.8   No results found for this basename: LABPT, INR,  in the last 72 hours  EXAM General - Patient is Alert and Appropriate Extremity - Neurovascular intact Sensation intact distally Dressing - dressing C/D/I Motor Function - intact, moving foot and toes well on exam.  Hemovac pulled without difficulty.  Past Medical History  Diagnosis Date  . Hypertension   . H/O renal calculi   . PONV (postoperative nausea and vomiting)   . Hyperthyroidism   . GERD (gastroesophageal reflux disease)   . Arthritis     Assessment/Plan: 1 Day Post-Op Procedure(s) (LRB): RIGHT TOTAL KNEE ARTHROPLASTY (Right) Principal Problem:   OA (osteoarthritis) of knee  Estimated body mass index is 27.09 kg/(m^2) as calculated from the following:   Height as of this encounter: 5\' 7"  (1.702 m).   Weight as of  this encounter: 78.472 kg (173 lb). Advance diet Up with therapy Plan for discharge tomorrow Discharge home with home health  DVT Prophylaxis - Xarelto Weight-Bearing as tolerated to right leg No vaccines. D/C O2 and Pulse OX and try on [address]  Patrica Duel 01/16/2013, 12:37 PM

## 2013-01-17 LAB — CBC
HCT: 36.2 % (ref 36.0–46.0)
Hemoglobin: 12.4 g/dL (ref 12.0–15.0)
MCH: 31 pg (ref 26.0–34.0)
RBC: 4 MIL/uL (ref 3.87–5.11)

## 2013-01-17 LAB — BASIC METABOLIC PANEL
CO2: 27 mEq/L (ref 19–32)
Calcium: 9.2 mg/dL (ref 8.4–10.5)
Creatinine, Ser: 0.64 mg/dL (ref 0.50–1.10)
GFR calc non Af Amer: >90 mL/min (ref >90)
Glucose, Bld: 141 mg/dL — ABNORMAL HIGH (ref 70–99)
Potassium: 4.4 mEq/L (ref 3.5–5.1)
Sodium: 135 mEq/L (ref 135–145)

## 2013-01-17 MED ORDER — TRAMADOL HCL 50 MG PO TABS: 50.0000 mg | ORAL_TABLET | Freq: Four times a day (QID) | ORAL | Status: DC | PRN

## 2013-01-17 MED ORDER — METHOCARBAMOL 500 MG PO TABS: 500.0000 mg | ORAL_TABLET | Freq: Four times a day (QID) | ORAL | Status: DC | PRN

## 2013-01-17 MED ORDER — OXYCODONE HCL 5 MG PO TABS: 5.0000 mg | ORAL_TABLET | ORAL | Status: DC | PRN

## 2013-01-17 MED ORDER — RIVAROXABAN 10 MG PO TABS: 10.0000 mg | ORAL_TABLET | Freq: Every day | ORAL | Status: DC

## 2013-01-17 NOTE — Progress Notes (Signed)
Occupational Therapy Treatment Patient Details Name: Deanna Erickson MRN: [id number] DOB: 1945-10-07 Today's Date: 01/17/2013 Time: 4098-1191 OT Time Calculation (min): 11 min  OT Assessment / Plan / Recommendation  History of present illness R TKA   OT comments  Pt doing well. Supposed to d/c today. Practiced shower transfer.  Follow Up Recommendations  No OT follow up;Supervision/Assistance - 24 hour    Barriers to Discharge       Equipment Recommendations  None recommended by OT    Recommendations for Other Services    Frequency Min 2X/week   Progress towards OT Goals Progress towards OT goals: Progressing toward goals  Plan Discharge plan remains appropriate    Precautions / Restrictions Precautions Precautions: Knee;Fall Precaution Comments: Instructed pt on KI use for amb Required Braces or Orthoses: Knee Immobilizer - Right Restrictions Weight Bearing Restrictions: No Other Position/Activity Restrictions: WBAT   Pertinent Vitals/Pain Pt states "its ok."     ADL  Tub/Shower Transfer: Min guard Tub/Shower Transfer Method:  (step back over shower ledge) Equipment Used: Rolling walker ADL Comments: Pt did well with shower transfer. Educated pt on how husband is to assist with steadying RW as she steps in and out of shower. Pt has 3in1 and states she feels comfortable with how to transfer on and off 3in1. Discussed KI when up until able to SLR including shower transfer . Discussed spogne bathing initially if she doesnt want to have to doff KI once in shower and redonn to step out.     OT Diagnosis:    OT Problem List:   OT Treatment Interventions:     OT Goals(current goals can now be found in the care plan section)    Visit Information  Last OT Received On: 01/17/13 Assistance Needed: +1 History of Present Illness: R TKA    Subjective Data      Prior Functioning       Cognition  Cognition Arousal/Alertness: Awake/alert Behavior During Therapy: WFL for  tasks assessed/performed Overall Cognitive Status: Within Functional Limits for tasks assessed    Mobility  Transfers Transfers: Sit to Stand;Stand to Sit Sit to Stand: 5: Supervision;With upper extremity assist;From chair/3-in-1 Stand to Sit: 5: Supervision;With upper extremity assist;To chair/3-in-1 Details for Transfer Assistance: did well with hand placement today    Exercises      Balance     End of Session OT - End of Session Equipment Utilized During Treatment: Rolling walker;Right knee immobilizer Activity Tolerance: Patient tolerated treatment well Patient left: in chair;with call bell/phone within reach CPM Right Knee CPM Right Knee: Off  GO     Lennox Laity [phone] 01/17/2013, 8:56 AM

## 2013-01-17 NOTE — Progress Notes (Signed)
   Subjective: 2 Days Post-Op Procedure(s) (LRB): RIGHT TOTAL KNEE ARTHROPLASTY (Right) Patient reports pain as mild.   Patient seen in rounds for Dr. Lequita Halt. Patient is well, and has had no acute complaints or problems Patient is ready to go home  Objective: Vital signs in last 24 hours: Temp:  [97.9 F (36.6 C)-99.2 F (37.3 C)] 99.2 F (37.3 C) (10/08 0330) Pulse Rate:  [74-86] 82 (10/08 0330) Resp:  [16-18] 16 (10/08 0330) BP: (151-170)/(77-90) 151/85 mmHg (10/08 0330) SpO2:  [92 %-97 %] 93 % (10/08 0330)  Intake/Output from previous day:  Intake/Output Summary (Last 24 hours) at 01/17/13 0758 Last data filed at 01/17/13 0426  Gross per 24 hour  Intake 871.25 ml  Output   1675 ml  Net -803.75 ml    Intake/Output this shift:    Labs:  Recent Labs  01/16/13 0405 01/17/13 0405  HGB 11.9* 12.4    Recent Labs  01/16/13 0405 01/17/13 0405  WBC 12.5* 12.1*  RBC 3.86* 4.00  HCT 34.9* 36.2  PLT 263 273    Recent Labs  01/16/13 0405 01/17/13 0405  NA 132* 135  K 3.8 4.4  CL 100 101  CO2 23 27  BUN 8 11  CREATININE 0.58 0.64  GLUCOSE 157* 141*  CALCIUM 8.8 9.2   No results found for this basename: LABPT, INR,  in the last 72 hours  EXAM: General - Patient is Alert, Appropriate and Oriented Extremity - Neurovascular intact Sensation intact distally Dorsiflexion/Plantar flexion intact Incision - clean, dry, no drainage, healing Motor Function - intact, moving foot and toes well on exam.   Assessment/Plan: 2 Days Post-Op Procedure(s) (LRB): RIGHT TOTAL KNEE ARTHROPLASTY (Right) Procedure(s) (LRB): RIGHT TOTAL KNEE ARTHROPLASTY (Right) Past Medical History  Diagnosis Date  . Hypertension   . H/O renal calculi   . PONV (postoperative nausea and vomiting)   . Hyperthyroidism   . GERD (gastroesophageal reflux disease)   . Arthritis    Principal Problem:   OA (osteoarthritis) of knee  Estimated body mass index is 27.09 kg/(m^2) as  calculated from the following:   Height as of this encounter: 5\' 7"  (1.702 m).   Weight as of this encounter: 78.472 kg (173 lb). Up with therapy Discharge home with home health Diet - Regular diet Follow up - in 2 weeks Activity - WBAT Disposition - Home Condition Upon Discharge - Good D/C Meds - See DC Summary DVT Prophylaxis - Xarelto  Deanna Erickson 01/17/2013, 7:58 AM

## 2013-01-17 NOTE — Discharge Summary (Signed)
Physician Discharge Summary   Patient ID: Deanna Erickson MRN: [id number] DOB/AGE: Sep 28, 1945 67 y.o.  Admit date: 01/15/2013 Discharge date: 01/17/2013  Primary Diagnosis:  Osteoarthritis Right knee(s)  Admission Diagnoses:  Past Medical History  Diagnosis Date  . Hypertension   . H/O renal calculi   . PONV (postoperative nausea and vomiting)   . Hyperthyroidism   . GERD (gastroesophageal reflux disease)   . Arthritis    Discharge Diagnoses:   Principal Problem:   OA (osteoarthritis) of knee  Estimated body mass index is 27.09 kg/(m^2) as calculated from the following:   Height as of this encounter: 5\' 7"  (1.702 m).   Weight as of this encounter: 78.472 kg (173 lb).  Procedure:  Procedure(s) (LRB): RIGHT TOTAL KNEE ARTHROPLASTY (Right)   Consults: None  HPI: Deanna Erickson is a 67 y.o. year old female with end stage OA of her right knee with progressively worsening pain and dysfunction. She has constant pain, with activity and at rest and significant functional deficits with difficulties even with ADLs. She has had extensive non-op management including analgesics, injections of cortisone and viscosupplements, and home exercise program, but remains in significant pain with significant dysfunction.Radiographs show bone on bone arthritis lateral and patellofemoral. She presents now for right Total Knee Arthroplasty.   Laboratory Data: Admission on 01/15/2013  Component Date Value Range Status  . ABO/RH(D) 01/15/2013 A POS   Final  . Antibody Screen 01/15/2013 NEG   Final  . Sample Expiration 01/15/2013 01/18/2013   Final  . ABO/RH(D) 01/15/2013 A POS   Final  . WBC 01/16/2013 12.5* 4.0 - 10.5 K/uL Final  . RBC 01/16/2013 3.86* 3.87 - 5.11 MIL/uL Final  . Hemoglobin 01/16/2013 11.9* 12.0 - 15.0 g/dL Final  . HCT 62/13/0865 34.9* 36.0 - 46.0 % Final  . MCV 01/16/2013 90.4  78.0 - 100.0 fL Final  . MCH 01/16/2013 30.8  26.0 - 34.0 pg Final  . MCHC 01/16/2013 34.1  30.0 -  36.0 g/dL Final  . RDW 78/46/9629 12.9  11.5 - 15.5 % Final  . Platelets 01/16/2013 263  150 - 400 K/uL Final  . Sodium 01/16/2013 132* 135 - 145 mEq/L Final  . Potassium 01/16/2013 3.8  3.5 - 5.1 mEq/L Final  . Chloride 01/16/2013 100  96 - 112 mEq/L Final  . CO2 01/16/2013 23  19 - 32 mEq/L Final  . Glucose, Bld 01/16/2013 157* 70 - 99 mg/dL Final  . BUN 52/84/1324 8  6 - 23 mg/dL Final  . Creatinine, Ser 01/16/2013 0.58  0.50 - 1.10 mg/dL Final  . Calcium 40/01/2724 8.8  8.4 - 10.5 mg/dL Final  . GFR calc non Af Amer 01/16/2013 >90  >90 mL/min Final  . GFR calc Af Amer 01/16/2013 >90  >90 mL/min Final   Comment: (NOTE)                          The eGFR has been calculated using the CKD EPI equation.                          This calculation has not been validated in all clinical situations.                          eGFR's persistently <90 mL/min signify possible Chronic Kidney  Disease.  . WBC 01/17/2013 12.1* 4.0 - 10.5 K/uL Final  . RBC 01/17/2013 4.00  3.87 - 5.11 MIL/uL Final  . Hemoglobin 01/17/2013 12.4  12.0 - 15.0 g/dL Final  . HCT 16/01/9603 36.2  36.0 - 46.0 % Final  . MCV 01/17/2013 90.5  78.0 - 100.0 fL Final  . MCH 01/17/2013 31.0  26.0 - 34.0 pg Final  . MCHC 01/17/2013 34.3  30.0 - 36.0 g/dL Final  . RDW 54/12/8117 13.1  11.5 - 15.5 % Final  . Platelets 01/17/2013 273  150 - 400 K/uL Final  . Sodium 01/17/2013 135  135 - 145 mEq/L Final  . Potassium 01/17/2013 4.4  3.5 - 5.1 mEq/L Final  . Chloride 01/17/2013 101  96 - 112 mEq/L Final  . CO2 01/17/2013 27  19 - 32 mEq/L Final  . Glucose, Bld 01/17/2013 141* 70 - 99 mg/dL Final  . BUN 14/78/2956 11  6 - 23 mg/dL Final  . Creatinine, Ser 01/17/2013 0.64  0.50 - 1.10 mg/dL Final  . Calcium 21/30/8657 9.2  8.4 - 10.5 mg/dL Final  . GFR calc non Af Amer 01/17/2013 >90  >90 mL/min Final  . GFR calc Af Amer 01/17/2013 >90  >90 mL/min Final   Comment: (NOTE)                          The eGFR has  been calculated using the CKD EPI equation.                          This calculation has not been validated in all clinical situations.                          eGFR's persistently <90 mL/min signify possible Chronic Kidney                          Disease.  Hospital Outpatient Visit on 01/02/2013  Component Date Value Range Status  . aPTT 01/02/2013 28  24 - 37 seconds Final  . WBC 01/02/2013 6.2  4.0 - 10.5 K/uL Final  . RBC 01/02/2013 4.68  3.87 - 5.11 MIL/uL Final  . Hemoglobin 01/02/2013 14.4  12.0 - 15.0 g/dL Final  . HCT 84/69/6295 42.7  36.0 - 46.0 % Final  . MCV 01/02/2013 91.2  78.0 - 100.0 fL Final  . MCH 01/02/2013 30.8  26.0 - 34.0 pg Final  . MCHC 01/02/2013 33.7  30.0 - 36.0 g/dL Final  . RDW 28/41/3244 13.2  11.5 - 15.5 % Final  . Platelets 01/02/2013 281  150 - 400 K/uL Final  . Sodium 01/02/2013 137  135 - 145 mEq/L Final  . Potassium 01/02/2013 4.2  3.5 - 5.1 mEq/L Final  . Chloride 01/02/2013 102  96 - 112 mEq/L Final  . CO2 01/02/2013 27  19 - 32 mEq/L Final  . Glucose, Bld 01/02/2013 98  70 - 99 mg/dL Final  . BUN 04/14/7251 12  6 - 23 mg/dL Final  . Creatinine, Ser 01/02/2013 0.69  0.50 - 1.10 mg/dL Final  . Calcium 66/44/0347 10.0  8.4 - 10.5 mg/dL Final  . Total Protein 01/02/2013 6.9  6.0 - 8.3 g/dL Final  . Albumin 42/59/5638 3.7  3.5 - 5.2 g/dL Final  . AST 75/64/3329 55* 0 - 37 U/L Final  . ALT 01/02/2013 88* 0 - 35  U/L Final  . Alkaline Phosphatase 01/02/2013 63  39 - 117 U/L Final  . Total Bilirubin 01/02/2013 0.3  0.3 - 1.2 mg/dL Final  . GFR calc non Af Amer 01/02/2013 88* >90 mL/min Final  . GFR calc Af Amer 01/02/2013 >90  >90 mL/min Final   Comment: (NOTE)                          The eGFR has been calculated using the CKD EPI equation.                          This calculation has not been validated in all clinical situations.                          eGFR's persistently <90 mL/min signify possible Chronic Kidney                           Disease.  Marland Kitchen Prothrombin Time 01/02/2013 12.3  11.6 - 15.2 seconds Final  . INR 01/02/2013 0.93  0.00 - 1.49 Final  . Color, Urine 01/02/2013 YELLOW  YELLOW Final  . APPearance 01/02/2013 CLEAR  CLEAR Final  . Specific Gravity, Urine 01/02/2013 1.012  1.005 - 1.030 Final  . pH 01/02/2013 7.0  5.0 - 8.0 Final  . Glucose, UA 01/02/2013 NEGATIVE  NEGATIVE mg/dL Final  . Hgb urine dipstick 01/02/2013 NEGATIVE  NEGATIVE Final  . Bilirubin Urine 01/02/2013 NEGATIVE  NEGATIVE Final  . Ketones, ur 01/02/2013 NEGATIVE  NEGATIVE mg/dL Final  . Protein, ur 04/54/0981 NEGATIVE  NEGATIVE mg/dL Final  . Urobilinogen, UA 01/02/2013 0.2  0.0 - 1.0 mg/dL Final  . Nitrite 19/14/7829 NEGATIVE  NEGATIVE Final  . Leukocytes, UA 01/02/2013 NEGATIVE  NEGATIVE Final   MICROSCOPIC NOT DONE ON URINES WITH NEGATIVE PROTEIN, BLOOD, LEUKOCYTES, NITRITE, OR GLUCOSE <1000 mg/dL.  Marland Kitchen MRSA, PCR 01/02/2013 NEGATIVE  NEGATIVE Final  . Staphylococcus aureus 01/02/2013 POSITIVE* NEGATIVE Final   Comment:                                 The Xpert SA Assay (FDA                          approved for NASAL specimens                          in patients over 73 years of age),                          is one component of                          a comprehensive surveillance                          program.  Test performance has                          been validated by [employer]  for patients greater                          than or equal to 85 year old.                          It is not intended                          to diagnose infection nor to                          guide or monitor treatment.     X-Rays:Dg Chest 2 View  01/02/2013   CLINICAL DATA:  Preop for TKA. History of hypertension.  EXAM: CHEST  2 VIEW  COMPARISON:  12/19/2006 scars on 12/18/2004  FINDINGS: Cardiomediastinal silhouette is within normal limits. The lungs are free of focal consolidations and pleural effusions. No  pulmonary edema. Surgical clips are identified in the right upper quadrant of the abdomen. Visualized osseous structures have a normal appearance.  IMPRESSION: No active cardiopulmonary disease.   Electronically Signed   By: Rosalie Gums M.D.   On: 01/02/2013 15:05    EKG: Orders placed during the hospital encounter of 01/02/13  . EKG 12-LEAD  . EKG 12-LEAD     Hospital Course: Deanna Erickson is a 67 y.o. who was admitted to [facility] They were brought to the operating room on 01/15/2013 and underwent Procedure(s): RIGHT TOTAL KNEE ARTHROPLASTY.  Patient tolerated the procedure well and was later transferred to the recovery room and then to the orthopaedic floor for postoperative care.  They were given PO and IV analgesics for pain control following their surgery.  They were given 24 hours of postoperative antibiotics of  Anti-infectives   Start     Dose/Rate Route Frequency Ordered Stop   01/15/13 1400  ceFAZolin (ANCEF) IVPB 1 g/50 mL premix     1 g 100 mL/hr over 30 Minutes Intravenous Every 6 hours 01/15/13 0953 01/15/13 2107   01/15/13 0600  ceFAZolin (ANCEF) IVPB 2 g/50 mL premix     2 g 100 mL/hr over 30 Minutes Intravenous On call to O.R. 01/15/13 0518 01/15/13 0715     and started on DVT prophylaxis in the form of Xarelto.   PT and OT were ordered for total joint protocol.  Discharge planning consulted to help with postop disposition and equipment needs.  Patient had a decent night on the evening of surgery.  They started to get up OOB with therapy on day one. Hemovac drain was pulled without difficulty.  Continued to work with therapy into day two.  Dressing was changed on day two and the incision was healing well.  Patient was seen in rounds and was ready to go home.   Discharge Medications: Prior to Admission medications   Medication Sig Start Date End Date Taking? Authorizing Provider  cetirizine (ZYRTEC) 10 MG tablet Take 10 mg by mouth every evening.    Yes  Historical Provider, MD  methimazole (TAPAZOLE) 5 MG tablet Take 2.5-5 mg by mouth daily. 2.5mg  on Sun,Mon,Wed,Fri 5mg  on Tu,Th,Sat   Yes Historical Provider, MD  metoprolol succinate (TOPROL-XL) 25 MG 24 hr tablet Take 25 mg by mouth at bedtime. Original med rec noted strength as 50mg , patient states now dose is 25mg    Yes Historical Provider, MD  olmesartan (BENICAR) 20 MG tablet Take 20 mg by mouth every morning.    Yes Historical Provider, MD  omeprazole (PRILOSEC) 20 MG capsule Take 20 mg by mouth daily.   Yes Historical Provider, MD  methocarbamol (ROBAXIN) 500 MG tablet Take 1 tablet (500 mg total) by mouth every 6 (six) hours as needed. 01/17/13   Alexzandrew Perkins, PA-C  oxyCODONE (OXY IR/ROXICODONE) 5 MG immediate release tablet Take 1-2 tablets (5-10 mg total) by mouth every 3 (three) hours as needed. 01/17/13   Alexzandrew Julien Girt, PA-C  rivaroxaban (XARELTO) 10 MG TABS tablet Take 1 tablet (10 mg total) by mouth daily with breakfast. Take Xarelto for two and a half more weeks, then discontinue Xarelto. Once the patient has completed the Xarelto, they may resume the 81 mg Aspirin. 01/17/13   Alexzandrew Perkins, PA-C  traMADol (ULTRAM) 50 MG tablet Take 1-2 tablets (50-100 mg total) by mouth every 6 (six) hours as needed (mild pain). 01/17/13   Alexzandrew Julien Girt, PA-C  Discharge home with home health  Diet - Regular diet  Follow up - in 2 weeks  Activity - WBAT  Disposition - Home  Condition Upon Discharge - Good  D/C Meds - See DC Summary  DVT Prophylaxis - Xarelto   Discharge Orders   Future Orders Complete By Expires   Call MD / Call 911  As directed    Comments:     If you experience chest pain or shortness of breath, CALL 911 and be transported to the hospital emergency room.  If you develope a fever above 101 F, pus (white drainage) or increased drainage or redness at the wound, or calf pain, call your surgeon's office.   Change dressing  As directed    Comments:      Change dressing daily with sterile 4 x 4 inch gauze dressing and apply TED hose. Do not submerge the incision under water.   Constipation Prevention  As directed    Comments:     Drink plenty of fluids.  Prune juice may be helpful.  You may use a stool softener, such as Colace (over the counter) 100 mg twice a day.  Use MiraLax (over the counter) for constipation as needed.   Diet general  As directed    Discharge instructions  As directed    Comments:     Pick up stool softner and laxative for home. Do not submerge incision under water. May shower. Continue to use ice for pain and swelling from surgery.  Take Xarelto for two and a half more weeks, then discontinue Xarelto. Once the patient has completed the Xarelto, they may resume the 81 mg Aspirin.   Do not put a pillow under the knee. Place it under the heel.  As directed    Do not sit on low chairs, stoools or toilet seats, as it may be difficult to get up from low surfaces  As directed    Driving restrictions  As directed    Comments:     No driving until released by the physician.   Increase activity slowly as tolerated  As directed    Lifting restrictions  As directed    Comments:     No lifting until released by the physician.   Patient may shower  As directed    Comments:     You may shower without a dressing once there is no drainage.  Do not wash over the wound.  If drainage remains, do not shower until drainage  stops.   TED hose  As directed    Comments:     Use stockings (TED hose) for 3 weeks on both leg(s).  You may remove them at night for sleeping.   Weight bearing as tolerated  As directed    Questions:     Laterality:     Extremity:         Medication List    STOP taking these medications       aspirin EC 81 MG tablet     CALTRATE 600+D 600-400 MG-UNIT per tablet  Generic drug:  Calcium Carbonate-Vitamin D     cholecalciferol 400 UNITS Tabs tablet  Commonly known as:  VITAMIN D     fish oil-omega-3  fatty acids 1000 MG capsule     multivitamins ther. w/minerals Tabs tablet      TAKE these medications       cetirizine 10 MG tablet  Commonly known as:  ZYRTEC  Take 10 mg by mouth every evening.     methimazole 5 MG tablet  Commonly known as:  TAPAZOLE  - Take 2.5-5 mg by mouth daily. 2.5mg  on Sun,Mon,Wed,Fri  - 5mg  on Tu,Th,Sat     methocarbamol 500 MG tablet  Commonly known as:  ROBAXIN  Take 1 tablet (500 mg total) by mouth every 6 (six) hours as needed.     metoprolol succinate 25 MG 24 hr tablet  Commonly known as:  TOPROL-XL  Take 25 mg by mouth at bedtime. Original med rec noted strength as 50mg , patient states now dose is 25mg      olmesartan 20 MG tablet  Commonly known as:  BENICAR  Take 20 mg by mouth every morning.     omeprazole 20 MG capsule  Commonly known as:  PRILOSEC  Take 20 mg by mouth daily.     oxyCODONE 5 MG immediate release tablet  Commonly known as:  Oxy IR/ROXICODONE  Take 1-2 tablets (5-10 mg total) by mouth every 3 (three) hours as needed.     rivaroxaban 10 MG Tabs tablet  Commonly known as:  XARELTO  - Take 1 tablet (10 mg total) by mouth daily with breakfast. Take Xarelto for two and a half more weeks, then discontinue Xarelto.  - Once the patient has completed the Xarelto, they may resume the 81 mg Aspirin.     traMADol 50 MG tablet  Commonly known as:  ULTRAM  Take 1-2 tablets (50-100 mg total) by mouth every 6 (six) hours as needed (mild pain).           Follow-up Information   Follow up with Loanne Drilling, MD. Schedule an appointment as soon as possible for a visit in 2 weeks.   Specialty:  Orthopedic Surgery   Contact information:   [address] [place] [address] [phone]       Signed: Patrica Duel 01/17/2013, 8:04 AM

## 2013-01-17 NOTE — Progress Notes (Signed)
Physical Therapy Treatment Patient Details Name: FRANCILE WOOLFORD MRN: [id number] DOB: [birth year] Today's Date: 01/17/2013 Time: 5621-3086 PT Time Calculation (min): 27 min  PT Assessment / Plan / Recommendation  History of Present Illness R TKA   PT Comments   POD # 2 am session.  Applied KI and instructed pt on use for amb and when to D/C.  Assisted pt OOB to amb to BR then amb in hallway.  Assisted pt up and over one step forward with RW.  Performed TKR TE's following handout HEP.  Instructed on use of ICE.  Pt plans to D/C to home today.    Follow Up Recommendations  Home health PT     Does the patient have the potential to tolerate intense rehabilitation     Barriers to Discharge        Equipment Recommendations  Rolling walker with 5" wheels    Recommendations for Other Services    Frequency 7X/week   Progress towards PT Goals Progress towards PT goals: Progressing toward goals  Plan      Precautions / Restrictions Precautions Precautions: Knee;Fall Precaution Comments: Instructed pt on KI use for amb Required Braces or Orthoses: Knee Immobilizer - Right Restrictions Weight Bearing Restrictions: No Other Position/Activity Restrictions: WBAT    Pertinent Vitals/Pain C/o 5/10 with TE's ICE applied    Mobility  Bed Mobility Bed Mobility: Supine to Sit Supine to Sit: 5: Supervision Details for Bed Mobility Assistance: increased time Transfers Transfers: Sit to Stand;Stand to Sit Sit to Stand: 5: Supervision;With upper extremity assist;From bed;From toilet Stand to Sit: 5: Supervision;With upper extremity assist;To chair/3-in-1;To toilet Details for Transfer Assistance: did well with hand placement today Ambulation/Gait Ambulation/Gait Assistance: 4: Min guard;5: Supervision Ambulation Distance (Feet): 112 Feet Assistive device: Rolling walker Ambulation/Gait Assistance Details: increased time Gait Pattern: Step-to pattern;Step-through pattern Gait velocity:  decreased Stairs: Yes Stairs Assistance: 4: Min guard Stair Management Technique: No rails;Forwards;With walker Number of Stairs: 1    Exercises   Total Knee Replacement TE's 10 reps B LE ankle pumps 10 reps knee presses 10 reps heel slides  10 reps SAQ's 10 reps SLR's 10 reps ABD Followed by ICE   PT Goals (current goals can now be found in the care plan section)    Visit Information  Last PT Received On: 01/17/13 Assistance Needed: +1 History of Present Illness: R TKA    Subjective Data      Cognition       Balance     End of Session PT - End of Session Equipment Utilized During Treatment: Gait belt Activity Tolerance: Patient tolerated treatment well Patient left: in chair;with call bell/phone within reach   Felecia Shelling  PTA [facility]  Acute  Rehab Pager      [phone]

## 2013-02-05 ENCOUNTER — Ambulatory Visit (HOSPITAL_COMMUNITY)
Admission: RE | Admit: 2013-02-05 | Discharge: 2013-02-05 | Disposition: A | Discharge status: Home / Self Care | Payer: Medicare Other | Source: Ambulatory Visit | Attending: Orthopedic Surgery | Admitting: Orthopedic Surgery

## 2013-02-05 DIAGNOSIS — M25661 Stiffness of right knee, not elsewhere classified: Secondary | ICD-10-CM

## 2013-02-05 DIAGNOSIS — R269 Unspecified abnormalities of gait and mobility: Secondary | ICD-10-CM | POA: Insufficient documentation

## 2013-02-05 DIAGNOSIS — Z96651 Presence of right artificial knee joint: Secondary | ICD-10-CM

## 2013-02-05 DIAGNOSIS — M25669 Stiffness of unspecified knee, not elsewhere classified: Secondary | ICD-10-CM | POA: Insufficient documentation

## 2013-02-05 DIAGNOSIS — R262 Difficulty in walking, not elsewhere classified: Secondary | ICD-10-CM

## 2013-02-05 DIAGNOSIS — M25569 Pain in unspecified knee: Secondary | ICD-10-CM | POA: Insufficient documentation

## 2013-02-05 DIAGNOSIS — IMO0001 Reserved for inherently not codable concepts without codable children: Secondary | ICD-10-CM | POA: Insufficient documentation

## 2013-02-05 NOTE — Evaluation (Addendum)
Physical Therapy Evaluation  Patient Details  Name: Deanna Erickson MRN: [id number] Date of Birth: 05-15-1945  Today's Date: 02/05/2013 Time: 1102-1135 PT Time Calculation (min): 33 min Charges: 1 eval             Visit#: 1 of 8  Re-eval: 03/07/13  Medicare Visit: 1 of 10  Assessment Diagnosis: Rt TKR Surgical Date: 01/15/13 Next MD Visit: Dr. Despina Hick - November 11th Prior Therapy: HHPT 8 visits  Past Medical History:  Past Medical History  Diagnosis Date  . Hypertension   . H/O renal calculi   . PONV (postoperative nausea and vomiting)   . Hyperthyroidism   . GERD (gastroesophageal reflux disease)   . Arthritis    Past Surgical History:  Past Surgical History  Procedure Laterality Date  . Cholecystectomy    . Bartholin gland cyst excision    . Knee arthroscopy  2000    bilateral  . Plantar fascia surgery      left  . Retinal detachment surgery  dec 2013  . Cataract extraction w/phaco  05/18/2012    Procedure: CATARACT EXTRACTION PHACO AND INTRAOCULAR LENS PLACEMENT (IOC);  Surgeon: Gemma Payor, MD;  Location: [facility];  Service: Ophthalmology;  Laterality: Right;  CDE: 13.75  . Tonsillectomy    . Tubal ligation    . Surgery right foot      shockwave for plantar fasciatis  . Total knee arthroplasty Right 01/15/2013    Procedure: RIGHT TOTAL KNEE ARTHROPLASTY;  Surgeon: Loanne Drilling, MD;  Location: [facility];  Service: Orthopedics;  Laterality: Right;    Subjective Symptoms/Limitations Symptoms: Pt is a 67 year old female referred to  How long can you sit comfortably?: 1 hour How long can you stand comfortably?: 5-10 minutes How long can you walk comfortably?: w/SPC 10 minutes  Patient Stated Goals: Get back into the pool Pain Assessment Currently in Pain?: No/denies Pain Score: 2  (stiffness) Pain Location: Knee Pain Relieving Factors: pain medication, muscle relaxor, 1-2x/day Effect of Pain on Daily Activities: unable to get into the pool.    Precautions/Restrictions     Balance Screening Balance Screen Has the patient fallen in the past 6 months: No Has the patient had a decrease in activity level because of a fear of falling? : Yes Is the patient reluctant to leave their home because of a fear of falling? : No  Prior Functioning  Prior Function Level of Independence: Independent with basic ADLs Vocation: Retired Comments: Pt likes to stay busy, swim and exercises  Cognition/Observation    Sensation/Coordination/Flexibility/Functional Tests Coordination Gross Motor Movements are Fluid and Coordinated: No Coordination and Movement Description: Max VC and TC for distal quadricep activation  Functional Tests Functional Tests: FOTO: Status: 41 Limitation: 49  Assessment RLE AROM (degrees) Right Knee Extension: 10 Right Knee Flexion: 90 RLE PROM (degrees) Right Knee Extension: 5 Right Knee Flexion: 103 RLE Strength Right Hip Flexion: 4/5 Right Hip Extension: 3+/5 Right Hip ABduction: 4/5 Right Hip ADduction: 4/5 Right Knee Flexion: 4/5 Right Knee Extension: 3+/5 Palpation Palpation: pain and tenderness with muscle spasms to posterior fibular head and semitendonsis instertion.  Maximal fascial restrictions to Rt patella with decreased patellar mobility in inferior and lateral movements  Mobility/Balance  Ambulation/Gait Ambulation/Gait: Yes Ambulation/Gait Assistance: 6: Modified independent (Device/Increase time) Assistive device: Straight cane Gait Pattern: Decreased hip/knee flexion - right;Right foot flat;Right flexed knee in stance;Lateral hip instability Static Standing Balance Single Leg Stance - Right Leg: 5 Single Leg Stance - Left Leg:  8 Tandem Stance - Right Leg: 8 Tandem Stance - Left Leg: 8 Rhomberg - Eyes Opened: 10 Rhomberg - Eyes Closed: 10   Exercise/Treatments Stretches Quad Stretch: 1 rep;30 seconds Aerobic Stationary Bike: 8 minutes, Seat 8 for ROM Supine Quad Sets: Right;10  reps;Limitations Quad Sets Limitations: 10 sec holds with VC and TC Short Arc Quad Sets: Right;10 reps;Limitations Water quality scientist Limitations: 5 sec holds Patellar Mobs: education and pt demonstration w/scar tissue massage Sidelying Hip ABduction: Right;10 reps Prone  Hamstring Curl: 5 reps;5 seconds Hip Extension: Right;10 reps Other Prone Exercises: TKE: 5 reps 5 sec holds  Physical Therapy Assessment and Plan PT Assessment and Plan Clinical Impression Statement: Pt is a 67 year old female referred to PT s/p Rt TKR on 01/15/13 with impairmens listed below. Pt will benefit from skilled therapeutic intervention in order to improve on the following deficits: Pain;Decreased strength;Decreased range of motion;Decreased balance;Abnormal gait;Difficulty walking;Impaired perceived functional ability;Improper body mechanics;Increased fascial restricitons;Decreased mobility Rehab Potential: Good PT Frequency: Min 2X/week PT Duration: 4 weeks PT Treatment/Interventions: DME instruction;Gait training;Stair training;Functional mobility training;Therapeutic activities;Therapeutic exercise;Patient/family education;Manual techniques PT Plan: continue w/NuStep or bike for ROM, focus on improving knee extension (standing TKE, supine and prone TKE, quad sets, gastroc/solues stretch, hamstring stretch) and move to improving knee flexion with LAQ's, heel slides, quad stretch.      Goals Home Exercise Program Pt/caregiver will Perform Home Exercise Program: Independently PT Goal: Perform Home Exercise Program - Progress: Goal set today PT Short Term Goals Time to Complete Short Term Goals: 2 weeks PT Short Term Goal 1: Pt will improve her Rt knee AROM 0-110 degrees for greater ease with sit to stand.  PT Short Term Goal 2: Pt will improve her RLE strength by 1 muscle grade for greater ease with walking independently with mild gait abnormalities.  PT Short Term Goal 3: Pt will improve her proprioceptive  awareness and demonstrate Rt and Lt SLS x15 seconds on solid surface to decrease falls risk.  PT Long Term Goals Time to Complete Long Term Goals: 4 weeks PT Long Term Goal 1: Pt will improve her Rt knee AROM 0-115 degrees for greater ease with ascending and descending steps to enter community dwellings. PT Long Term Goal 2: Pt will improve her RLE strength to [facility] in order to ascend and descend 10 stairs with 1 handrail with alternating pattern.  Long Term Goal 3: Pt will improve her dynamic balance and ambulate independently with minimal gait impairments to decrease risk of secondary injury.  Long Term Goal 4: Pt will improve her FOTO to status greater than 52% and limiation less than 48% for improved percieved fucntional ability and have greater ease returning to swimming activites.   Problem List Patient Active Problem List   Diagnosis Date Noted  . Status post total right knee replacement 02/05/2013  . Difficulty in walking(719.7) 02/05/2013  . Stiffness of right knee 02/05/2013  . OA (osteoarthritis) of knee 01/15/2013    PT - End of Session Activity Tolerance: Patient tolerated treatment well PT Plan of Care PT Home Exercise Plan: given Consulted and Agree with Plan of Care: Patient;Family member/caregiver Family Member Consulted: husband  GP Functional Limitation: Mobility: Walking and moving around Mobility: Walking and Moving Around Current Status [phone]): At least 40 percent but less than 60 percent impaired, limited or restricted Mobility: Walking and Moving Around Goal Status [phone]): At least 40 percent but less than 60 percent impaired, limited or restricted  Hawthorne Day, MPT, ATC 02/05/2013,  12:24 PM  Physician Documentation Your signature is required to indicate approval of the treatment plan as stated above.  Please sign and either send electronically or make a copy of this report for your files and return this physician signed original.   Please mark one  1.__approve of plan  2. ___approve of plan with the following conditions.   ______________________________                                                          _____________________ Physician Signature                                                                                                             Date

## 2013-02-07 ENCOUNTER — Ambulatory Visit (HOSPITAL_COMMUNITY)
Admission: RE | Admit: 2013-02-07 | Discharge: 2013-02-07 | Disposition: A | Discharge status: Home / Self Care | Payer: Medicare Other | Source: Ambulatory Visit | Attending: Family Medicine | Admitting: Family Medicine

## 2013-02-07 DIAGNOSIS — M1710 Unilateral primary osteoarthritis, unspecified knee: Secondary | ICD-10-CM

## 2013-02-07 DIAGNOSIS — M25661 Stiffness of right knee, not elsewhere classified: Secondary | ICD-10-CM

## 2013-02-07 DIAGNOSIS — R262 Difficulty in walking, not elsewhere classified: Secondary | ICD-10-CM

## 2013-02-07 DIAGNOSIS — Z96651 Presence of right artificial knee joint: Secondary | ICD-10-CM

## 2013-02-07 NOTE — Progress Notes (Signed)
Physical Therapy Treatment Patient Details  Name: Deanna Erickson MRN: [id number] Date of Birth: 29-May-1945  Today's Date: 02/07/2013 Time: 0800-0840 PT Time Calculation (min): 40 min Charge: TE [id number]  Visit#: 2 of 8  Re-eval: 03/07/13 Assessment Diagnosis: Rt TKR Surgical Date: 01/15/13 Next MD Visit: Dr. Despina Hick - November 11th Prior Therapy: HHPT 8 visits  Authorization: BCBS Medicare  Authorization Time Period:    Authorization Visit#: 2 of 10   Subjective: Symptoms/Limitations Symptoms: Pt reported compliance with HEP daily, currently pain free. Pain Assessment Currently in Pain?: No/denies  Precautions/Restrictions  Precautions Precautions: Knee;Fall Precaution Comments: Instructed pt on KI use for amb Required Braces or Orthoses: Knee Immobilizer - Right  Exercise/Treatments Stretches Passive Hamstring Stretch: 3 reps;30 seconds Quad Stretch: 3 reps;30 seconds Gastroc Stretch: 2 reps;30 seconds;1 rep;Limitations Gastroc Stretch Limitations: slant board 1 rep  Soleus Stretch: 2 reps;1 rep;30 seconds;Limitations Soleus Stretch Limitations: slant board 1 rep Aerobic Stationary Bike: 8 minutes, Seat 8 for ROM Standing Heel Raises: 10 reps;Limitations Heel Raises Limitations: toe raises 10x Terminal Knee Extension: Right;10 reps;Theraband Functional Squat: 10 reps Supine Quad Sets: Right;10 reps;Limitations Quad Sets Limitations: 10 second hold Short Arc Quad Sets: Right;10 reps;Limitations Terminal Knee Extension: Right;10 reps;Limitations Terminal Knee Extension Limitations: 5" holds Patellar Mobs: education and pt demonstration w/scar tissue massage Prone  Hamstring Curl: 10 reps Hip Extension: Right;10 reps Other Prone Exercises: TKE: 10 reps 5 sec holds      Physical Therapy Assessment and Plan PT Assessment and Plan Clinical Impression Statement: Began treatment focusing on quad strengthening to improve knee extension.  Pt with decreased  coordination with distal quadricep activation, tactile cueing to improve quad contraction.  Pt educated on joint mobs to improve patella mobilty to reduce adhesions.  Encouraged pt to apply ice to reduce pain and edema.   PT Plan: continue w/NuStep or bike for ROM, focus on improving knee extension (standing TKE, supine and prone TKE, quad sets, gastroc/solues stretch, hamstring stretch) and move to improving knee flexion with LAQ's, heel slides, quad stretch.      Goals Home Exercise Program Pt/caregiver will Perform Home Exercise Program: Independently PT Short Term Goals Time to Complete Short Term Goals: 2 weeks PT Short Term Goal 1: Pt will improve her Rt knee AROM 0-110 degrees for greater ease with sit to stand.  PT Short Term Goal 1 - Progress: Progressing toward goal PT Short Term Goal 2: Pt will improve her RLE strength by 1 muscle grade for greater ease with walking independently with mild gait abnormalities.  PT Short Term Goal 2 - Progress: Progressing toward goal PT Short Term Goal 3: Pt will improve her proprioceptive awareness and demonstrate Rt and Lt SLS x15 seconds on solid surface to decrease falls risk.  PT Long Term Goals Time to Complete Long Term Goals: 4 weeks PT Long Term Goal 1: Pt will improve her Rt knee AROM 0-115 degrees for greater ease with ascending and descending steps to enter community dwellings. PT Long Term Goal 2: Pt will improve her RLE strength to [facility] in order to ascend and descend 10 stairs with 1 handrail with alternating pattern.  Long Term Goal 3: Pt will improve her dynamic balance and ambulate independently with minimal gait impairments to decrease risk of secondary injury.  Long Term Goal 4: Pt will improve her FOTO to status greater than 52% and limiation less than 48% for improved percieved fucntional ability and have greater ease returning to swimming activites.   Problem List Patient  Active Problem List   Diagnosis Date Noted  . Status post  total right knee replacement 02/05/2013  . Difficulty in walking(719.7) 02/05/2013  . Stiffness of right knee 02/05/2013  . OA (osteoarthritis) of knee 01/15/2013    PT - End of Session Activity Tolerance: Patient tolerated treatment well General Behavior During Therapy: [facility] for tasks assessed/performed  GP    Juel Burrow 02/07/2013, 8:45 AM

## 2013-02-12 ENCOUNTER — Ambulatory Visit (HOSPITAL_COMMUNITY)
Admission: RE | Admit: 2013-02-12 | Discharge: 2013-02-12 | Disposition: A | Discharge status: Home / Self Care | Payer: Medicare Other | Source: Ambulatory Visit | Attending: Family Medicine | Admitting: Family Medicine

## 2013-02-12 DIAGNOSIS — Z96651 Presence of right artificial knee joint: Secondary | ICD-10-CM

## 2013-02-12 DIAGNOSIS — M1710 Unilateral primary osteoarthritis, unspecified knee: Secondary | ICD-10-CM

## 2013-02-12 DIAGNOSIS — M25669 Stiffness of unspecified knee, not elsewhere classified: Secondary | ICD-10-CM | POA: Insufficient documentation

## 2013-02-12 DIAGNOSIS — M179 Osteoarthritis of knee, unspecified: Secondary | ICD-10-CM

## 2013-02-12 DIAGNOSIS — R269 Unspecified abnormalities of gait and mobility: Secondary | ICD-10-CM | POA: Insufficient documentation

## 2013-02-12 DIAGNOSIS — M25661 Stiffness of right knee, not elsewhere classified: Secondary | ICD-10-CM

## 2013-02-12 DIAGNOSIS — IMO0001 Reserved for inherently not codable concepts without codable children: Secondary | ICD-10-CM | POA: Insufficient documentation

## 2013-02-12 DIAGNOSIS — R262 Difficulty in walking, not elsewhere classified: Secondary | ICD-10-CM

## 2013-02-12 DIAGNOSIS — M25569 Pain in unspecified knee: Secondary | ICD-10-CM | POA: Insufficient documentation

## 2013-02-12 NOTE — Progress Notes (Signed)
Physical Therapy Treatment Patient Details  Name: Deanna Erickson MRN: [id number] Date of Birth: Aug 15, 1945  Today's Date: 02/12/2013 Time: 0802-0848 PT Time Calculation (min): 46 min Charge: TE [id number], Gait [id number]  Visit#: 3 of 8  Re-eval: 03/07/13 Assessment Diagnosis: Rt TKR Surgical Date: 01/15/13 Next MD Visit: Dr. Despina Hick - November 11th Prior Therapy: HHPT 8 visits  Authorization: BCBS Medicare  Authorization Time Period:    Authorization Visit#: 3 of 10   Subjective: Symptoms/Limitations Symptoms: Pt stated pain free today, has been compliant with HEP and riding her bicycle at home. Pain Assessment Currently in Pain?: No/denies  Precautions/Restrictions  Precautions Precautions: Knee;Fall Precaution Comments: Instructed pt on KI use for amb Required Braces or Orthoses: Knee Immobilizer - Right  Exercise/Treatments Stretches Passive Hamstring Stretch: 3 reps;30 seconds Gastroc Stretch: 3 reps;30 seconds Gastroc Stretch Limitations: slant board 3 rep  Aerobic Stationary Bike: 8 minutes, Seat 8 for ROM Elliptical:   Standing Heel Raises: 15 reps;Limitations Heel Raises Limitations: toe raises 15x Terminal Knee Extension: Right;15 reps;Theraband Theraband Level (Terminal Knee Extension): Level 4 (Blue) Functional Squat: 10 reps;Limitations Functional Squat Limitations: PT facilitated Rocker Board: 2 minutes;Limitations Rocker Board Limitations: R/L  Gait Training: w/o AD cueing for heel to toe and equalize stance phase Seated Long Arc Quad: 15 reps;Weights Long Arc Quad Weight: 3 lbs. Supine Quad Sets: Right;15 reps;Limitations Quad Sets Limitations: 10 second holds Short Arc [employer]: Right;15 reps;Limitations Water quality scientist Limitations: 5 sec holds Heel Slides: Right;10 reps;Limitations Heel Slides Limitations: 5 sec holds Terminal Knee Extension: Right;15 reps;Limitations Terminal Knee Extension Limitations: 5" holds Patellar Mobs:  done Sidelying Hip ABduction: Right;15 reps;Limitations Hip ABduction Limitations: 3# Hip ADduction: Right;15 reps;Limitations Hip ADduction Limitations: 3# Prone  Hamstring Curl: 15 reps;Limitations Hamstring Curl Limitations: 3# Hip Extension: Right;15 reps Other Prone Exercises: TKE: 15 reps 5 sec holds      Physical Therapy Assessment and Plan PT Assessment and Plan Clinical Impression Statement: Pt progressing well towards ROM and improving gait mechanics with LRAD.  AROM 8-108 degrees today.  Pt does require tactile cueing to improve coordination with distal quadriceps activation.  Began LAQ for quad strengthening and heel slides to improve ROM and began gait training this session with no AD.  Able to add 3# to some exercises for hip musculature and hamstring strengthening.   PT Plan: Continue wiht current POC w/ NuStep or bike for ROM, fiocus on improving knee extension and knee flexion, improve gait mechanics with no AD.      Goals Home Exercise Program Pt/caregiver will Perform Home Exercise Program: Independently PT Short Term Goals Time to Complete Short Term Goals: 2 weeks PT Short Term Goal 1: Pt will improve her Rt knee AROM 0-110 degrees for greater ease with sit to stand.  PT Short Term Goal 1 - Progress: Progressing toward goal PT Short Term Goal 2: Pt will improve her RLE strength by 1 muscle grade for greater ease with walking independently with mild gait abnormalities.  PT Short Term Goal 2 - Progress: Progressing toward goal PT Short Term Goal 3: Pt will improve her proprioceptive awareness and demonstrate Rt and Lt SLS x15 seconds on solid surface to decrease falls risk.  PT Long Term Goals Time to Complete Long Term Goals: 4 weeks PT Long Term Goal 1: Pt will improve her Rt knee AROM 0-115 degrees for greater ease with ascending and descending steps to enter community dwellings. PT Long Term Goal 2: Pt will improve her RLE strength to  WFL in order to ascend and  descend 10 stairs with 1 handrail with alternating pattern.  Long Term Goal 3: Pt will improve her dynamic balance and ambulate independently with minimal gait impairments to decrease risk of secondary injury.  Long Term Goal 4: Pt will improve her FOTO to status greater than 52% and limiation less than 48% for improved percieved fucntional ability and have greater ease returning to swimming activites.   Problem List Patient Active Problem List   Diagnosis Date Noted  . Status post total right knee replacement 02/05/2013  . Difficulty in walking(719.7) 02/05/2013  . Stiffness of right knee 02/05/2013  . OA (osteoarthritis) of knee 01/15/2013    PT - End of Session Activity Tolerance: Patient tolerated treatment well General Behavior During Therapy: [facility] for tasks assessed/performed  GP    Juel Burrow 02/12/2013, 9:11 AM

## 2013-02-14 ENCOUNTER — Ambulatory Visit (HOSPITAL_COMMUNITY)
Admission: RE | Admit: 2013-02-14 | Discharge: 2013-02-14 | Disposition: A | Discharge status: Home / Self Care | Payer: Medicare Other | Source: Ambulatory Visit | Attending: Family Medicine | Admitting: Family Medicine

## 2013-02-14 DIAGNOSIS — M25661 Stiffness of right knee, not elsewhere classified: Secondary | ICD-10-CM

## 2013-02-14 DIAGNOSIS — M1710 Unilateral primary osteoarthritis, unspecified knee: Secondary | ICD-10-CM

## 2013-02-14 DIAGNOSIS — Z96651 Presence of right artificial knee joint: Secondary | ICD-10-CM

## 2013-02-14 DIAGNOSIS — R262 Difficulty in walking, not elsewhere classified: Secondary | ICD-10-CM

## 2013-02-14 NOTE — Progress Notes (Signed)
Physical Therapy Treatment Patient Details  Name: Deanna Erickson MRN: [id number] Date of Birth: 1945-10-15  Today's Date: 02/14/2013 Time: 0802-0855 PT Time Calculation (min): 53 min Charge: TE [id number], YNW 2956-2130  Visit#: 4 of 8  Re-eval: 03/07/13    Authorization: BCBS Medicare  Authorization Time Period:    Authorization Visit#: 4 of 10   Subjective: Symptoms/Limitations Symptoms: Pt stated she is currently pain free, stated most of her pain com\es at night.   Pain Assessment Currently in Pain?: No/denies  Precautions/Restrictions  Precautions Precautions: Knee;Fall Precaution Comments: Instructed pt on KI use for amb Required Braces or Orthoses: Knee Immobilizer - Right  Exercise/Treatments Stretches Passive Hamstring Stretch: 3 reps;30 seconds;Limitations Passive Hamstring Stretch Limitations: with rope Gastroc Stretch: 3 reps;30 seconds Gastroc Stretch Limitations: slant board 3 rep  Aerobic Stationary Bike: 8 minutes @ 3.0, Seat 7 for ROM Standing Heel Raises: Limitations Heel Raises Limitations: heel and toe walking 1RT Terminal Knee Extension: Right;15 reps;Theraband Theraband Level (Terminal Knee Extension): Level 4 (Blue) Lateral Step Up: Right;10 reps;Hand Hold: 1;Step Height: 4" Functional Squat: 10 reps;Limitations Rocker Board: 2 minutes;Limitations Rocker Board Limitations: R/L and F/B SLS: 11" max of 5 Gait Training: w/o AD cueing for heel to toe and equalize stance phase Supine Quad Sets: Right;15 reps;Limitations Short Arc Quad Sets: Right;15 reps;Limitations Heel Slides: Right;10 reps;Limitations Heel Slides Limitations: 5 sec holds Terminal Knee Extension: Right;15 reps;Limitations Terminal Knee Extension Limitations: 5" holds Straight Leg Raises: 10 reps;Limitations Straight Leg Raises Limitations: 3# Prone  Hamstring Curl: 15 reps;Limitations Hamstring Curl Limitations: 3# Hip Extension: Right;15 reps;Limitations Hip Extension  Limitations: 3# Other Prone Exercises: TKE: 15 reps 5 sec holds   Modalities Modalities: Cryotherapy Cryotherapy Number Minutes Cryotherapy: 10 Minutes Cryotherapy Location: Knee Type of Cryotherapy: Ice pack (with elevation)  Physical Therapy Assessment and Plan PT Assessment and Plan Clinical Impression Statement: Pt making gains in improved gait mechanics and increase AROM 3-112 degrees.  Progressed exercises for quad strengthening and balance training to improve gait mechanics.   PT Plan: Continue wiht current POC change to Nustep for LE strengthening or TM for gait training, focus on improving knee extension and knee flexion, improve gait mechanics with no AD.      Goals Home Exercise Program Pt/caregiver will Perform Home Exercise Program: Independently PT Short Term Goals Time to Complete Short Term Goals: 2 weeks PT Short Term Goal 1: Pt will improve her Rt knee AROM 0-110 degrees for greater ease with sit to stand.  PT Short Term Goal 1 - Progress: Progressing toward goal PT Short Term Goal 2: Pt will improve her RLE strength by 1 muscle grade for greater ease with walking independently with mild gait abnormalities.  PT Short Term Goal 2 - Progress: Progressing toward goal PT Short Term Goal 3: Pt will improve her proprioceptive awareness and demonstrate Rt and Lt SLS x15 seconds on solid surface to decrease falls risk.  PT Short Term Goal 3 - Progress: Progressing toward goal PT Long Term Goals Time to Complete Long Term Goals: 4 weeks PT Long Term Goal 1: Pt will improve her Rt knee AROM 0-115 degrees for greater ease with ascending and descending steps to enter community dwellings. PT Long Term Goal 2: Pt will improve her RLE strength to [facility] in order to ascend and descend 10 stairs with 1 handrail with alternating pattern.  Long Term Goal 3: Pt will improve her dynamic balance and ambulate independently with minimal gait impairments to decrease risk of secondary injury.  Long Term Goal 4: Pt will improve her FOTO to status greater than 52% and limiation less than 48% for improved percieved fucntional ability and have greater ease returning to swimming activites.   Problem List Patient Active Problem List   Diagnosis Date Noted  . Status post total right knee replacement 02/05/2013  . Difficulty in walking(719.7) 02/05/2013  . Stiffness of right knee 02/05/2013  . OA (osteoarthritis) of knee 01/15/2013    PT - End of Session Activity Tolerance: Patient tolerated treatment well  GP    Juel Burrow 02/14/2013, 12:02 PM

## 2013-02-15 ENCOUNTER — Other Ambulatory Visit: Payer: Self-pay

## 2013-02-19 ENCOUNTER — Ambulatory Visit (HOSPITAL_COMMUNITY)
Admission: RE | Admit: 2013-02-19 | Discharge: 2013-02-19 | Disposition: A | Discharge status: Home / Self Care | Payer: Medicare Other | Source: Ambulatory Visit | Attending: Family Medicine | Admitting: Family Medicine

## 2013-02-19 NOTE — Progress Notes (Signed)
Physical Therapy Progress Note  Patient Details  Name: Deanna Erickson MRN: [id number] Date of Birth: [birth year]  Today's Date: 02/19/2013 Time: 0800-0850 PT Time Calculation (min): 50 min              Visit#: 5 of 8  Diagnosis: Rt TKR Surgical Date: 01/15/13 Next MD Visit: Dr. Despina Hick - November 11th Prior Therapy: HHPT 8 visits Authorization: BCBS Medicare    Authorization Visit#: 5 of 10 Charges:  therex [phone] (40'), icepack [phone] (10')   Subjective Symptoms/Limitations Symptoms: Pt states she returns to MD tomorrow. No pain currently, however states she had alot of pain over the weekend but believes she did too much. Pain Assessment Currently in Pain?: No/denies  Precautions/Restrictions  Precautions Precautions: Knee;Fall   Objective: RLE AROM (degrees) Right Knee Extension: 3 (was lacking 10 degrees on 10/27) Right Knee Flexion: 120 (was 90 degrees on 10/27) RLE Strength Right Hip Flexion: 5/5 (was 4/5) Right Hip Extension: 4/5 (was 3+/5) Right Hip ABduction: 5/5 (was 4/5) Right Hip ADduction: 5/5 (was 4/5) Right Knee Flexion: 4/5 (was 4/5) Right Knee Extension: 5/5 (was 3+/5)  Exercise/Treatments Aerobic Stationary Bike: 8 minutes @ 5.0, Seat 7 for ROM Standing Heel Raises: 15 reps;Limitations Lateral Step Up: Right;10 reps;Hand Hold: 1;Step Height: 4" Prone  Hamstring Curl: 15 reps;Limitations Hamstring Curl Limitations: 3# Hip Extension: Right;15 reps;Limitations Hip Extension Limitations: 3#  Modalities: icepack Rt knee with elevation at end of session    Physical Therapy Assessment and Plan PT Assessment and Plan PT Assessment:  Pt has completed 5/8 PT treatments following Rt TKR.  Pt is progressing well toward all goals.  Current AROM of rt knee is 3-120 degrees.  Rt knee flexion and hip extension continues to be weakest.  All other mm are WNL.  Pt has 3 visits remaining and may be ready for discharge to HEP at that time.  Discussed return to  gym and aquatic exercises. PT Plan: Continue to progress toward goals. Change to Nustep for LE strengthening or TM for gait training.  Add stair training, forward step downs, vector stance and dynamic balance activities.     Goals Home Exercise Program Pt/caregiver will Perform Home Exercise Program: Independently - Progress: Met  PT Short Term Goals Time to Complete Short Term Goals: 2 weeks PT Short Term Goal 1: Pt will improve her Rt knee AROM 0-110 degrees for greater ease with sit to stand.- Progress: Partly met PT Short Term Goal 2: Pt will improve her RLE strength by 1 muscle grade for greater ease with walking independently with mild gait abnormalities Progress: Partly met PT Short Term Goal 3: Pt will improve her proprioceptive awareness and demonstrate Rt and Lt SLS x15 seconds on solid surface to decrease falls risk.   Progress: Met  PT Long Term Goals Time to Complete Long Term Goals: 4 weeks PT Long Term Goal 1: Pt will improve her Rt knee AROM 0-115 degrees for greater ease with ascending and descending steps to enter community dwellings. - Progress: Progressing toward goal PT Long Term Goal 2: Pt will improve her RLE strength to [facility] in order to ascend and descend 10 stairs with 1 handrail with alternating pattern.   Progress: Progressing toward goal Long Term Goal 3: Pt will improve her dynamic balance and ambulate independently with minimal gait impairments to decrease risk of secondary injury.  Progressing toward goal Long Term Goal 4: Pt will improve her FOTO to status greater than 52% and limitation less than 48% for  improved percieved fucntional ability and have greater ease returning to swimming activites:  Progress:  FOTO not assessed today  Problem List Patient Active Problem List   Diagnosis Date Noted  . Status post total right knee replacement 02/05/2013  . Difficulty in walking(719.7) 02/05/2013  . Stiffness of right knee 02/05/2013  . OA (osteoarthritis) of  knee 01/15/2013       Lurena Nida, PTA/CLT; Annett Fabian, MPT,ATC  02/19/2013, 9:49 AM

## 2013-02-21 ENCOUNTER — Ambulatory Visit (HOSPITAL_COMMUNITY)
Admission: RE | Admit: 2013-02-21 | Discharge: 2013-02-21 | Disposition: A | Discharge status: Home / Self Care | Payer: Medicare Other | Source: Ambulatory Visit | Attending: Family Medicine | Admitting: Family Medicine

## 2013-02-21 NOTE — Progress Notes (Signed)
Physical Therapy Treatment Patient Details  Name: Deanna Erickson MRN: [id number] Date of Birth: [birth year]  Today's Date: 02/21/2013 Time: 0800-0855 PT Time Calculation (min): 55 min  Visit#: 6 of 8  Re-eval: 03/02/13 Authorization: BCBS Medicare  Authorization Visit#: 6 of 10  Charges:  therex [phone] (40'), ice [phone]  (10')  Subjective: Symptoms/Limitations Symptoms: Pt states MD was pleased.  States he got 0-124 AROM in Rt knee.  Pt to continue last 2 appointments and will be ready for discharge. states she's still having stiffness that wakes her at night but otherwise doing great.  Pain Assessment Currently in Pain?: No/denies   Exercise/Treatments Stretches ITB Stretch: 2 reps;60 seconds;Limitations ITB Stretch Limitations: against wall Aerobic Stationary Bike: Nustep 8' hills #3, resistance 3 Standing Heel Raises Limitations: heel and toe walking 1RT Forward Lunges: Both;10 reps Lateral Step Up: Right;10 reps;Hand Hold: 1;Step Height: 4" Forward Step Up: Right;10 reps;Step Height: 4";Hand Hold: 1 Step Down: Right;10 reps;Step Height: 4";Hand Hold: 1 Functional Squat: 15 reps Stairs: 1 flight reciprocally without HR 1RT SLS with Vectors: 5X5" Rt LE Prone  Hamstring Curl: 15 reps;Limitations Hamstring Curl Limitations: 5# Hip Extension: Right;15 reps;Limitations Hip Extension Limitations: 5#   Modalities Modalities: Cryotherapy Cryotherapy Number Minutes Cryotherapy: 10 Minutes Cryotherapy Location: Knee Type of Cryotherapy: Ice pack  Physical Therapy Assessment and Plan PT Assessment and Plan Clinical Impression Statement: focused on stair training and return to functional activities.  Pt states she has stairs to her basement without HR's that she would like to be able to negoitiate.  Pt able to complete 1 flight of stairs reciprocally without HR's or pain.  General weakness noted decending.  Added forward step ups and downs to POC to work on eccentric  stengthening.  Also instructed in lunges for functional activities at home and vector stance for stability.  Pt with noted tightness in ITB.  Added standing ITB stretch and was given written HEP insttruction.  Suggested pt ice knee prior to bedtime to see if helps decrease pain and waking at night. PT Plan: Continue to focus on functional strength and mobility.  Complete next 2 appointments then anticipate discharge from skilled therapy and return to aquatic exercise at [facility].       Problem List Patient Active Problem List   Diagnosis Date Noted  . Status post total right knee replacement 02/05/2013  . Difficulty in walking(719.7) 02/05/2013  . Stiffness of right knee 02/05/2013  . OA (osteoarthritis) of knee 01/15/2013    PT - End of Session Activity Tolerance: Patient tolerated treatment well General Behavior During Therapy: WFL for tasks assessed/performed   Lurena Nida, PTA/CLT 02/21/2013, 9:14 AM

## 2013-02-26 ENCOUNTER — Ambulatory Visit (HOSPITAL_COMMUNITY)
Admission: RE | Admit: 2013-02-26 | Discharge: 2013-02-26 | Disposition: A | Discharge status: Home / Self Care | Payer: Medicare Other | Source: Ambulatory Visit | Attending: Family Medicine | Admitting: Family Medicine

## 2013-02-26 NOTE — Progress Notes (Signed)
Physical Therapy Treatment Patient Details  Name: Deanna Erickson MRN: [id number] Date of Birth: 06-Dec-1945  Today's Date: 02/26/2013 Time: 0800-0850 PT Time Calculation (min): 50 min Visit#: 7 of 8  Re-eval:  next visit Authorization: BCBS Medicare  Authorization Visit#: 7 of 10  Charges:  therex [phone] (38'), ice [phone] (10')  Subjective: Symptoms/Limitations Symptoms: Pt reports she is doing well and anticipates return to aquatics at the [facility] next week.  Next visit is her last appointment. Pain Assessment Currently in Pain?: No/denies   Exercise/Treatments Stretches ITB Stretch: 2 reps;60 seconds;Limitations ITB Stretch Limitations: against wall Aerobic Stationary Bike: Nustep 8' hills #3, resistance 3 Standing Heel Raises Limitations: heel and toe walking 1RT Forward Lunges: Both;10 reps Lateral Step Up: Right;10 reps;Hand Hold: 1;Step Height: 6" Forward Step Up: Right;10 reps;Hand Hold: 1;Step Height: 6" Step Down: Right;10 reps;Hand Hold: 1;Step Height: 6" Functional Squat: 15 reps Stairs: 2 flights reciprocally without HR 1RT Rocker Board: 2 minutes;Limitations Rocker Board Limitations: R/L  SLS with Vectors: 5X5" Rt LE   Modalities Modalities: Cryotherapy Cryotherapy Number Minutes Cryotherapy: 10 Minutes Cryotherapy Location: Knee Type of Cryotherapy: Ice pack  Physical Therapy Assessment and Plan PT Assessment and Plan Clinical Impression Statement: Pt has progressed well with therapy.  Able to increase to 6" step today with exercises, negotiate 2 flights of steps reciprocally without HR's.  Instructed with standing knee flexion and knee flexion chair with standing.  Pt will need updated, advanced HEP and review for discharge. PT Plan: Give advanced HEP including steps, lunges, wall squats, ITB stetch, SLS, etc. PT will need to complete FOTO.  Pt will be ready for discharge from skilled therapy and return to aquatic exercise at [facility] next visit.       Problem List Patient Active Problem List   Diagnosis Date Noted  . Status post total right knee replacement 02/05/2013  . Difficulty in walking(719.7) 02/05/2013  . Stiffness of right knee 02/05/2013  . OA (osteoarthritis) of knee 01/15/2013    PT - End of Session Activity Tolerance: Patient tolerated treatment well General Behavior During Therapy: WFL for tasks assessed/performed   Lurena Nida, PTA/CLT 02/26/2013, 8:56 AM

## 2013-02-28 ENCOUNTER — Ambulatory Visit (HOSPITAL_COMMUNITY)
Admission: RE | Admit: 2013-02-28 | Discharge: 2013-02-28 | Disposition: A | Discharge status: Home / Self Care | Payer: Medicare Other | Source: Ambulatory Visit | Attending: Physical Therapy | Admitting: Physical Therapy

## 2013-02-28 DIAGNOSIS — M1710 Unilateral primary osteoarthritis, unspecified knee: Secondary | ICD-10-CM

## 2013-02-28 DIAGNOSIS — R262 Difficulty in walking, not elsewhere classified: Secondary | ICD-10-CM

## 2013-02-28 DIAGNOSIS — Z96651 Presence of right artificial knee joint: Secondary | ICD-10-CM

## 2013-02-28 DIAGNOSIS — M25661 Stiffness of right knee, not elsewhere classified: Secondary | ICD-10-CM

## 2013-02-28 NOTE — Evaluation (Signed)
Physical Therapy Discharge Summary/treatment Patient Details  Name: ARMONI KLUDT MRN: [id number] Date of Birth: 1945-07-13  Today's Date: 02/28/2013 Time: 0933-1020 PT Time Calculation (min): 47 min Charges:  TE: [phone] ROM/MMT: [phone] Manual: 1000-1010 Ice: 1             Visit#: 8 of 8  Re-eval:   Assessment Diagnosis: Rt TKR Surgical Date: 01/15/13 Next MD Visit: Dr. Despina Hick - November 11th Prior Therapy: HHPT 8 visits  Authorization: BCBS Medicare    Authorization Time Period:    Authorization Visit#: 8 of 10   Subjective Symptoms/Limitations Symptoms: Pt reports she is feeling great.  She is ready for D/C to HEP. 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  Reports mild soreness which is worse at night.  How long can you walk comfortably?: independently no difficulty (was w/SPC x10 minutes)  Sensation/Coordination/Flexibility/Functional Tests Functional Tests Functional Tests: FOTO: Status: 64%, Limiations 39%(was Status: 41 Limitation: 49)  Assessment RLE AROM (degrees) Right Knee Extension: 2 (was 3) Right Knee Flexion: 120 (was 120) RLE Strength Right Hip Flexion: 5/5 (was 5/5) Right Hip Extension: 5/5 (was 4/5) Right Hip ABduction: 5/5 (was 5/5) Right Hip ADduction: 5/5 (was 5/5) Right Knee Flexion: 4/5 (was 4/5) Right Knee Extension: 5/5 (was 5/5)  Mobility/Balance  Ambulation/Gait Ambulation/Gait: Yes Ambulation/Gait Assistance: 7: Independent (was MOD I w/SPC) Assistive device: None Gait Pattern: Within Functional Limits Static Standing Balance Single Leg Stance - Right Leg: 20 (was 5) Single Leg Stance - Left Leg: 20 (was 8) Tandem Stance - Right Leg: 20 (was 8) Tandem Stance - Left Leg: 20 (was 8) Rhomberg - Eyes Opened: 20 (was 10) Rhomberg - Eyes Closed: 20 (was 10)   Exercise/Treatments Aerobic Stationary Bike: Nustep 10' hills #3, resistance 3 for LE strength and AROM Machines for  Strengthening Cybex Knee Extension: RLE 1 PL x10; LLE 2 PL x10 Cybex Knee Flexion: RLE 2.5 PL x10, LLE 2.5 PL x10 Standing Forward Lunges: Both;10 reps;Limitations Forward Lunges Limitations: BOSU Side Lunges: Both;10 reps;Limitations Side Lunges Limitations: BOSU Functional Squat: 15 reps;Limitations Functional Squat Limitations: BOSU w/min A   Manual Therapy Manual Therapy: Myofascial release Myofascial Release: with "the stick" to Rt ITB to decrease pain and for education Cryotherapy Number Minutes Cryotherapy: 10 Minutes Cryotherapy Location: Knee Type of Cryotherapy: Ice pack  Physical Therapy Assessment and Plan PT Assessment and Plan Clinical Impression Statement: Mrs Toso has attended OP PT visits for 4 weeks s/p Rt TKR with following findings: her knee AROM: 2-120, continues to have most weakness to Rt knee flexion and gluteus.  Today session focused on education for patient to return to the [facility].  At this time pt is ready for D/C with HEP.  PT Plan: D/C    Goals Home Exercise Program Pt/caregiver will Perform Home Exercise Program: Independently PT Goal: Perform Home Exercise Program - Progress: Met PT Short Term Goals Time to Complete Short Term Goals: 2 weeks PT Short Term Goal 1: Pt will improve her Rt knee AROM 0-110 degrees for greater ease with sit to stand.  PT Short Term Goal 1 - Progress: Partly met PT Short Term Goal 2: Pt will improve her RLE strength by 1 muscle grade for greater ease with walking independently with mild gait abnormalities.  PT Short Term Goal 2 - Progress: Met PT Short Term Goal 3: Pt will improve her proprioceptive awareness and demonstrate Rt and Lt SLS x15 seconds on solid surface to decrease  falls risk.  PT Short Term Goal 3 - Progress: Met PT Long Term Goals Time to Complete Long Term Goals: 4 weeks PT Long Term Goal 1: Pt will improve her Rt knee AROM 0-115 degrees for greater ease with ascending and descending steps to enter  community dwellings. (2-120) PT Long Term Goal 1 - Progress: Partly met PT Long Term Goal 2: Pt will improve her RLE strength to [facility] in order to ascend and descend 10 stairs with 1 handrail with alternating pattern.  PT Long Term Goal 2 - Progress: Met Long Term Goal 3: Pt will improve her dynamic balance and ambulate independently with minimal gait impairments to decrease risk of secondary injury.  Long Term Goal 3 Progress: Met Long Term Goal 4: Pt will improve her FOTO to status greater than 52% and limiation less than 48% for improved percieved fucntional ability and have greater ease returning to swimming activites.  Long Term Goal 4 Progress: Met  Problem List Patient Active Problem List   Diagnosis Date Noted  . Status post total right knee replacement 02/05/2013  . Difficulty in walking(719.7) 02/05/2013  . Stiffness of right knee 02/05/2013  . OA (osteoarthritis) of knee 01/15/2013    PT - End of Session Activity Tolerance: Patient tolerated treatment well General Behavior During Therapy: WFL for tasks assessed/performed PT Plan of Care PT Patient Instructions: education on machines and BOSU at the [facility]. [employer], multimedia  GP Functional Assessment Tool Used: Foto: 64%, 36% Functional Limitation: Mobility: Walking and moving around Mobility: Walking and Moving Around Goal Status [phone]): At least 40 percent but less than 60 percent impaired, limited or restricted Mobility: Walking and Moving Around Discharge Status [phone]): At least 20 percent but less than 40 percent impaired, limited or restricted  Kenidy Crossland, MPT, ATC 02/28/2013, 10:31 AM  Physician Documentation Your signature is required to indicate approval of the treatment plan as stated above.  Please sign and either send electronically or make a copy of this report for your files and return this physician signed original.   Please mark one 1.__approve of plan  2. ___approve of plan with the following  conditions.   ______________________________                                                          _____________________ Physician Signature                                                                                                             Date

## 2014-01-25 ENCOUNTER — Other Ambulatory Visit: Payer: Self-pay

## 2014-02-05 IMAGING — CR DG CHEST 2V
2 series · 2 of 2 positions shown · non-contrast
Comparison: 12/19/2006 scars on 12/18/2004

CLINICAL DATA: Preop for TKA. History of hypertension.

EXAM:
CHEST  2 VIEW

[w chest pa]
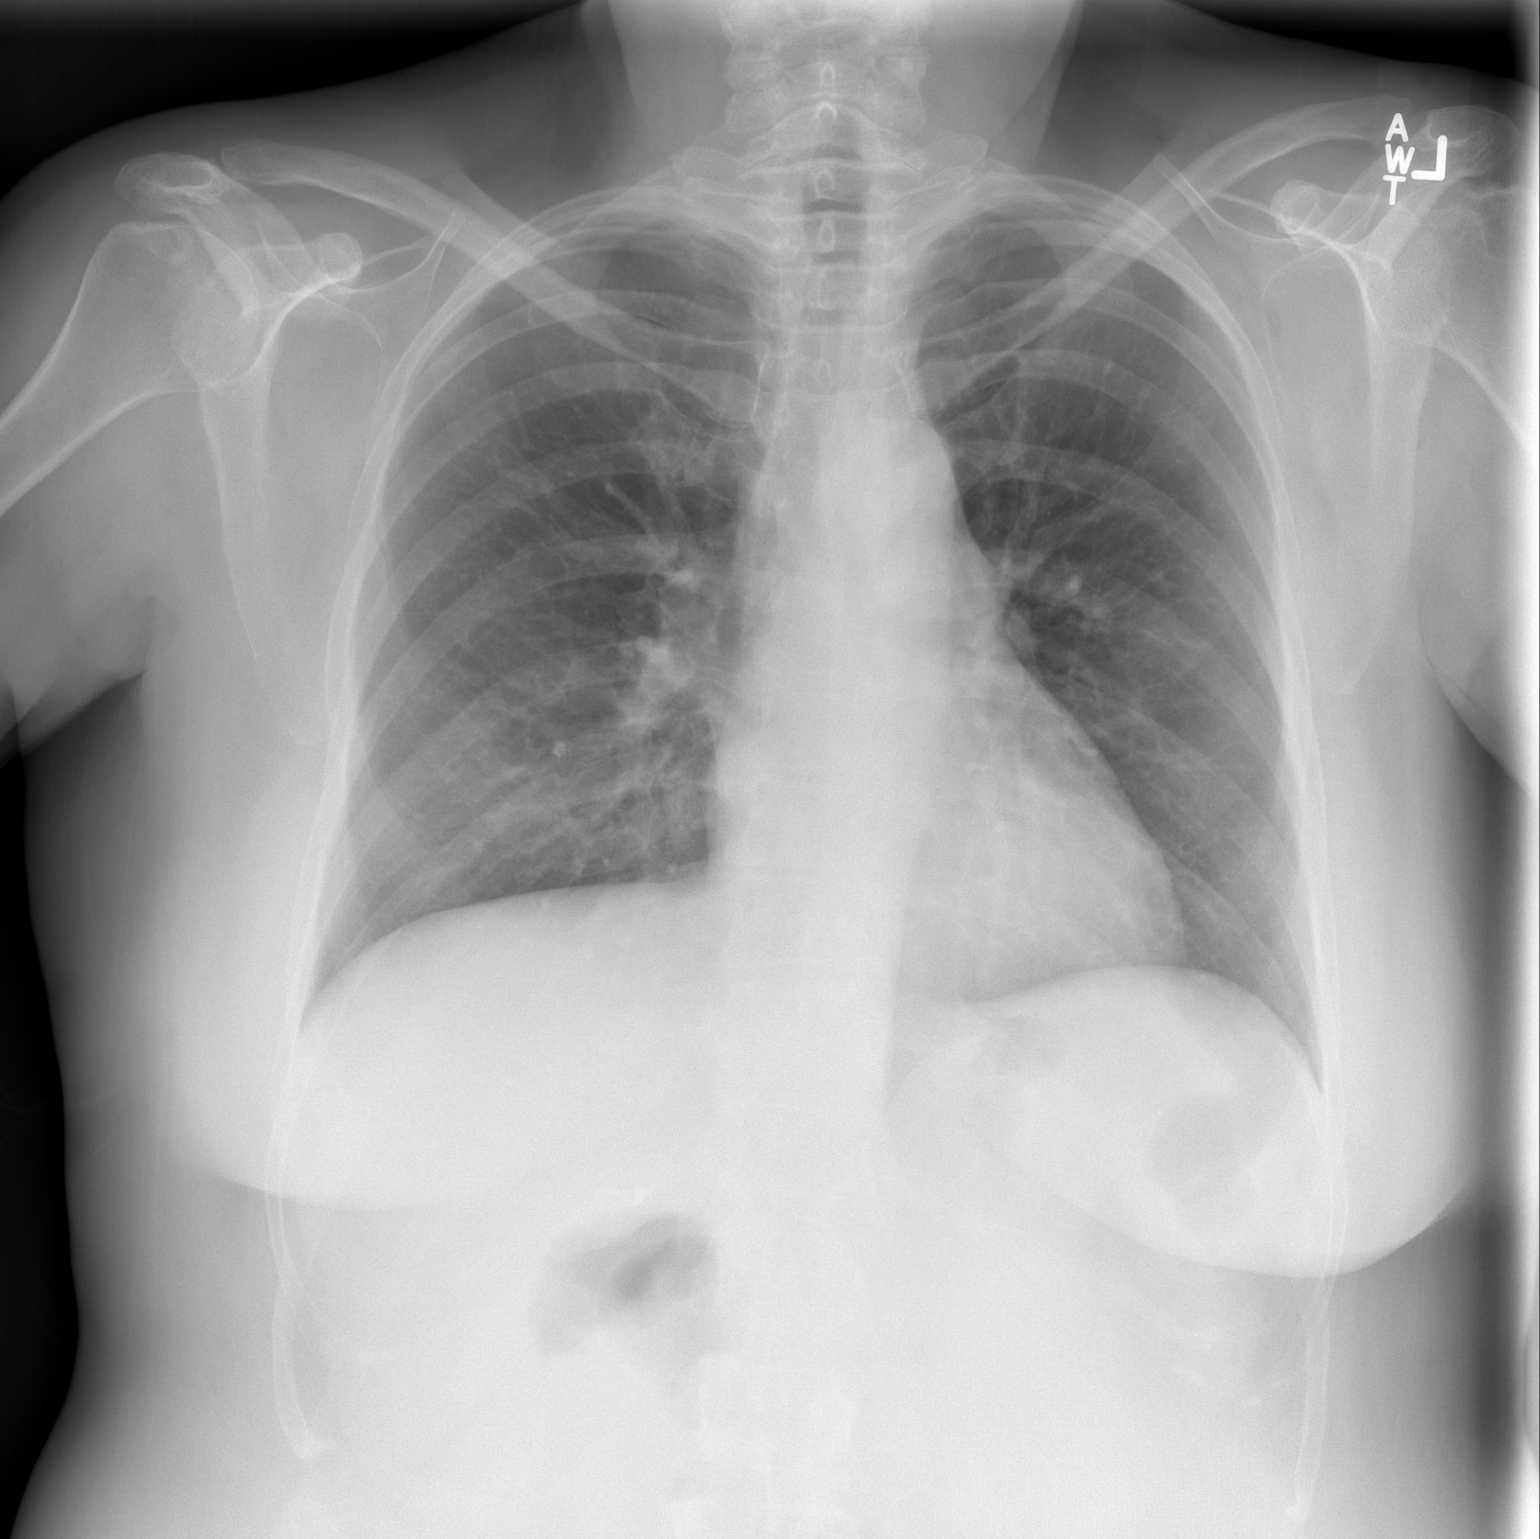

[w chest lat]
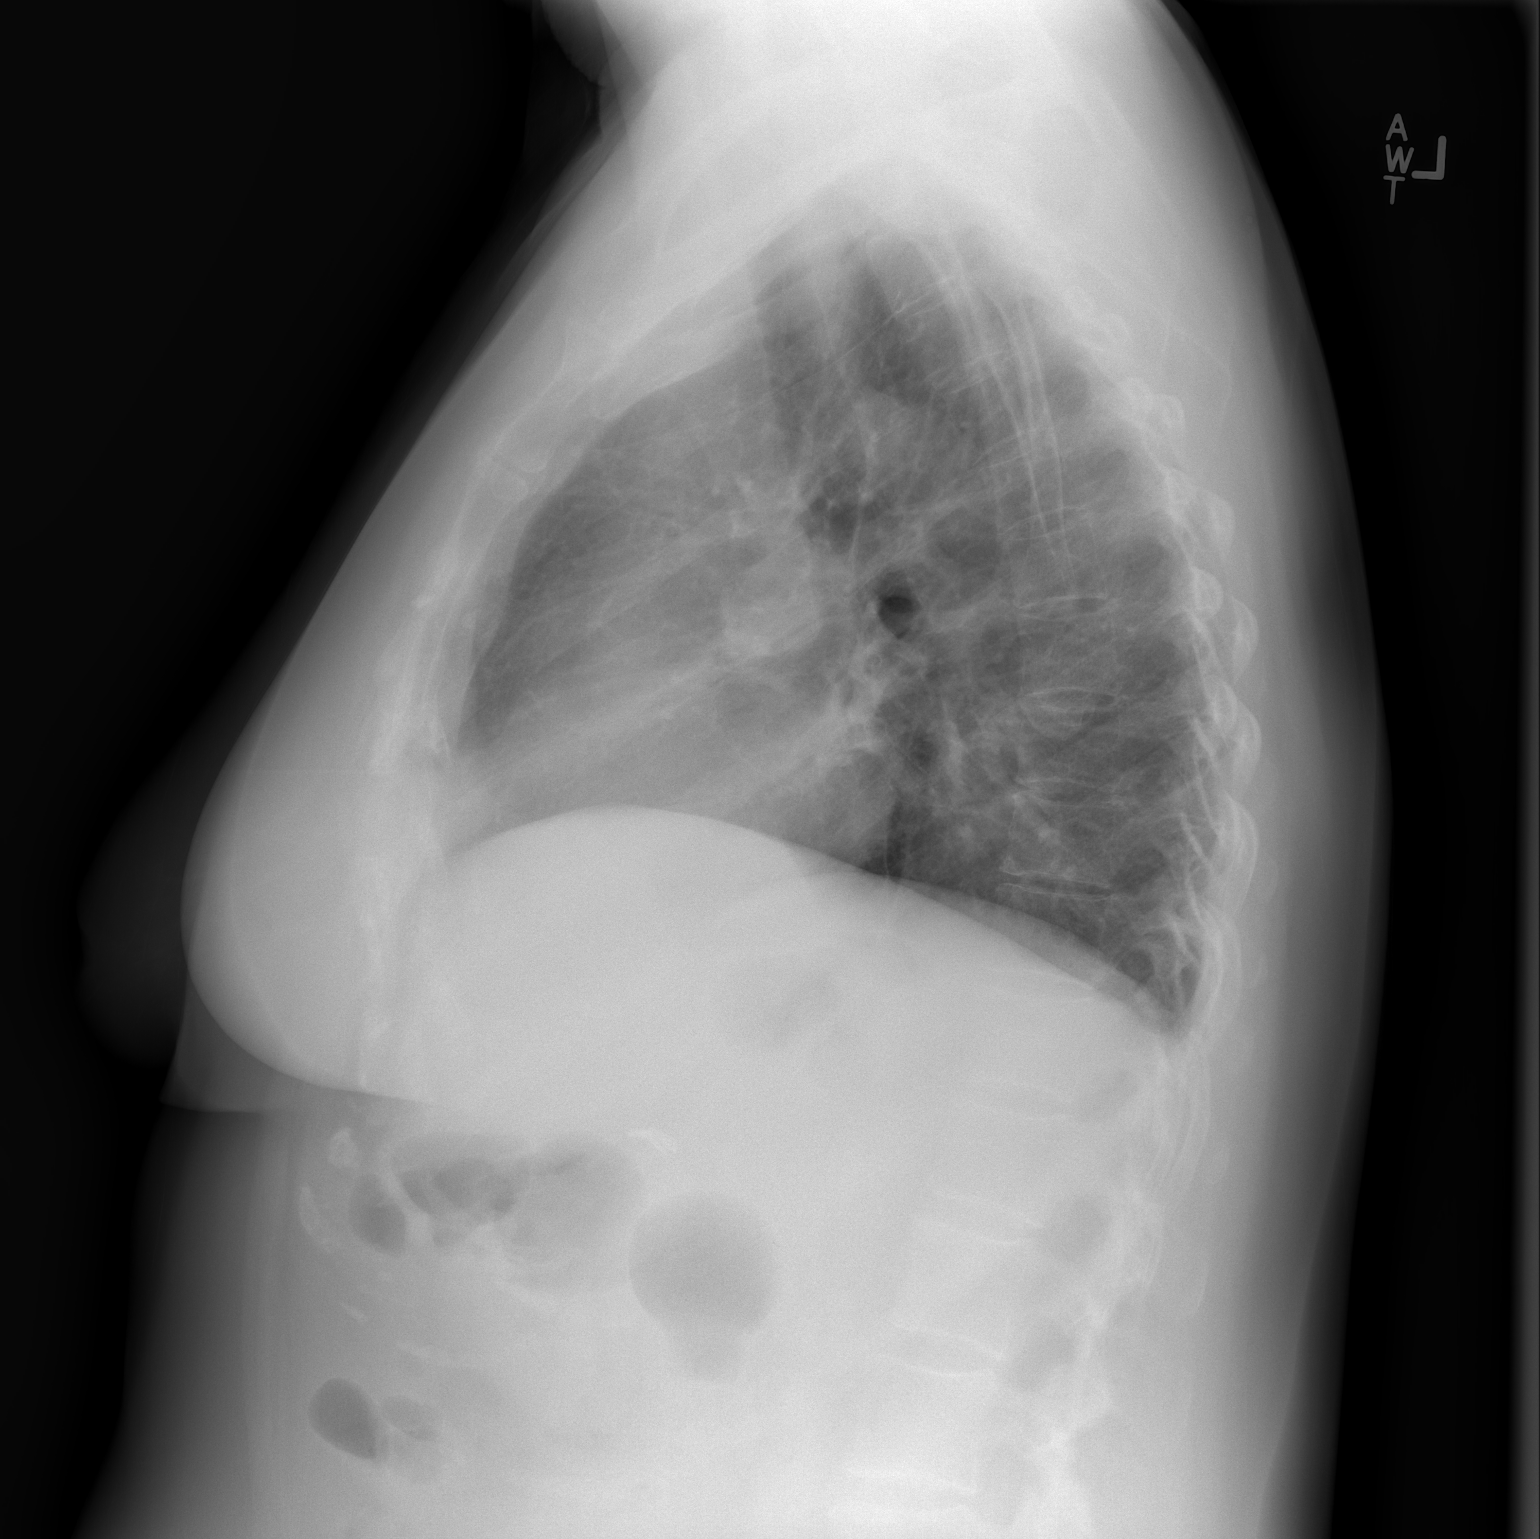

[2 of 2 positions shown; findings below may reference images not displayed]

FINDINGS: Cardiomediastinal silhouette is within normal limits. The lungs are
free of focal consolidations and pleural effusions. No pulmonary
edema. Surgical clips are identified in the right upper quadrant of
the abdomen. Visualized osseous structures have a normal appearance.
IMPRESSION: No active cardiopulmonary disease.

## 2014-02-14 ENCOUNTER — Ambulatory Visit (INDEPENDENT_AMBULATORY_CARE_PROVIDER_SITE_OTHER): Payer: Medicare Other | Admitting: Orthopedic Surgery

## 2014-02-14 ENCOUNTER — Encounter: Payer: Self-pay | Admitting: Orthopedic Surgery

## 2014-02-14 VITALS — BP 130/88 | Ht 66.0 in | Wt 171.0 lb

## 2014-02-14 DIAGNOSIS — S82402A Unspecified fracture of shaft of left fibula, initial encounter for closed fracture: Secondary | ICD-10-CM

## 2014-02-14 DIAGNOSIS — S82409A Unspecified fracture of shaft of unspecified fibula, initial encounter for closed fracture: Secondary | ICD-10-CM | POA: Insufficient documentation

## 2014-02-14 NOTE — Progress Notes (Signed)
Patient ID: Deanna Erickson, female   DOB: [birth year], 68 y.o.   MRN: [id number] Patient ID: Deanna Erickson, female   DOB: [birth year], 68 y.o.   MRN: [id number]  Chief Complaint  Patient presents with  . Ankle Injury    ankle injury, DOI 02/13/14    HPI Deanna Erickson is a 68 y.o. female.  Left ankle pain times a day. Patient fell at home on her back door steps slipping on the mat. Complains of left ankle pain swelling slight decreased range of motion but able to weight-bear. Pain rated 4 out of 10.  Patient's x-rays on disc patient has a Weber a fracture by my independent interpretation system review stiff joints swollen joint all other systems negative medical history as recorded below. Allergies to anti-inflammatory medication she cannot take those.  HPI  Past Medical History  Diagnosis Date  . Hypertension   . H/O renal calculi   . PONV (postoperative nausea and vomiting)   . Hyperthyroidism   . GERD (gastroesophageal reflux disease)   . Arthritis     Past Surgical History  Procedure Laterality Date  . Cholecystectomy    . Bartholin gland cyst excision    . Knee arthroscopy  2000    bilateral  . Plantar fascia surgery      left  . Retinal detachment surgery  dec 2013  . Cataract extraction w/phaco  05/18/2012    Procedure: CATARACT EXTRACTION PHACO AND INTRAOCULAR LENS PLACEMENT (IOC);  Surgeon: Gemma PayorKerry Hunt, MD;  Location: [facility];  Service: Ophthalmology;  Laterality: Right;  CDE: 13.75  . Tonsillectomy    . Tubal ligation    . Surgery right foot      shockwave for plantar fasciatis  . Total knee arthroplasty Right 01/15/2013    Procedure: RIGHT TOTAL KNEE ARTHROPLASTY;  Surgeon: Loanne DrillingFrank V Aluisio, MD;  Location: [facility];  Service: Orthopedics;  Laterality: Right;    Family History  Problem Relation Age of Onset  . Hypertension Mother   . Hypertension Father   . Breast cancer Sister     Social History History  Substance Use Topics  . Smoking status: Never Smoker   .  Smokeless tobacco: Never Used  . Alcohol Use: No    Allergies  Allergen Reactions  . Ace Inhibitors Cough  . Nsaids     Causes increase in BP.     Current Outpatient Prescriptions  Medication Sig Dispense Refill  . cetirizine (ZYRTEC) 10 MG tablet Take 10 mg by mouth every evening.     . methimazole (TAPAZOLE) 5 MG tablet Take 2.5-5 mg by mouth daily. 2.5mg  on Sun,Mon,Wed,Fri 5mg  on Tu,Th,Sat    . methocarbamol (ROBAXIN) 500 MG tablet Take 1 tablet (500 mg total) by mouth every 6 (six) hours as needed. 80 tablet 0  . metoprolol succinate (TOPROL-XL) 25 MG 24 hr tablet Take 25 mg by mouth at bedtime. Original med rec noted strength as 50mg , patient states now dose is 25mg     . olmesartan (BENICAR) 20 MG tablet Take 20 mg by mouth every morning.     Marland Kitchen. omeprazole (PRILOSEC) 20 MG capsule Take 20 mg by mouth daily.    Marland Kitchen. oxyCODONE (OXY IR/ROXICODONE) 5 MG immediate release tablet Take 1-2 tablets (5-10 mg total) by mouth every 3 (three) hours as needed. 80 tablet 0  . rivaroxaban (XARELTO) 10 MG TABS tablet Take 1 tablet (10 mg total) by mouth daily with breakfast. Take Xarelto for two and a half more weeks,  then discontinue Xarelto. Once the patient has completed the Xarelto, they may resume the 81 mg Aspirin. 19 tablet 0  . traMADol (ULTRAM) 50 MG tablet Take 1-2 tablets (50-100 mg total) by mouth every 6 (six) hours as needed (mild pain). 60 tablet 0   No current facility-administered medications for this visit.    Review of Systems Review of Systems  Blood pressure 130/88, height 5\' 6"  (1.676 m), weight 171 lb (77.565 kg).  Physical Exam Physical Exam BP 130/88 mmHg  Ht 5\' 6"  (1.676 m)  Wt 171 lb (77.565 kg)  BMI 27.61 kg/m2 The patient is well-groomed he is oriented to person place and time her mood is pleasant her affect is normal she walks with a slight limp  Right left ankle comparison left ankle is swollen right ankle was not lateral malleolus is tender on the left on the  right it is not both ankles have normal range of motion and stability tests were normal strength and muscle tone normal in the left skin normal on the left pulse normal on the left and right. Sensation normal in both.   Data Reviewed A ankle fracture nondisplaced  Assessment    Short Cam Walker weightbearing as tolerated     Plan    X-ray in 6 weeks        Fuller CanadaStanley Abby Tucholski 02/14/2014, 5:51 PM

## 2014-02-14 NOTE — Patient Instructions (Signed)
BOOT FOR 6 WEEKS

## 2014-02-20 ENCOUNTER — Telehealth: Payer: Self-pay | Admitting: *Deleted

## 2014-02-20 NOTE — Telephone Encounter (Signed)
PATIENT STATES SHE IS HAVING TROUBLE WITH THE CAM WALKER, STATES IT PRESSES ON HER INJURY AND IT HURTS, IS UNABLE TO WALK ON IT FOR FOR ANY LENGTH OF TIME DUE TO IT HURTING, STATES SHE HAS TRIED PUTTING THINGS IN BOOT BETWEEN INJURY TO CUSHION BUT NOTHING WORKS, WHAT DO YOU SUGGEST

## 2014-02-20 NOTE — Telephone Encounter (Signed)
Change her to an air cast NON weight bearing   Do not use appt slot for this

## 2014-02-21 NOTE — Telephone Encounter (Signed)
Patient aware.

## 2014-03-28 ENCOUNTER — Ambulatory Visit (INDEPENDENT_AMBULATORY_CARE_PROVIDER_SITE_OTHER): Payer: Medicare Other

## 2014-03-28 ENCOUNTER — Ambulatory Visit (INDEPENDENT_AMBULATORY_CARE_PROVIDER_SITE_OTHER): Payer: Self-pay | Admitting: Orthopedic Surgery

## 2014-03-28 ENCOUNTER — Encounter: Payer: Self-pay | Admitting: Orthopedic Surgery

## 2014-03-28 VITALS — BP 136/81 | Ht 66.0 in | Wt 171.0 lb

## 2014-03-28 DIAGNOSIS — S82892A Other fracture of left lower leg, initial encounter for closed fracture: Secondary | ICD-10-CM

## 2014-03-28 NOTE — Progress Notes (Signed)
Patient ID: Wadie LessenRachel V Celani, female   DOB: July 29, 1945, 68 y.o.   MRN: [id number]  Chief Complaint  Patient presents with  . Follow-up    follow up and xray left ankle, DOI 02/13/14    Fracture care  No pain at this time, no tenderness full alignment is normal. Full range of motion noted.  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

## 2014-09-24 ENCOUNTER — Other Ambulatory Visit: Payer: Self-pay | Admitting: Obstetrics and Gynecology

## 2014-09-26 LAB — CYTOLOGY - PAP

## 2014-10-07 ENCOUNTER — Other Ambulatory Visit: Payer: Self-pay

## 2015-08-19 ENCOUNTER — Encounter: Payer: Self-pay | Admitting: Gastroenterology

## 2017-05-31 ENCOUNTER — Other Ambulatory Visit: Payer: Self-pay | Admitting: Physician Assistant

## 2017-05-31 DIAGNOSIS — R52 Pain, unspecified: Secondary | ICD-10-CM

## 2017-06-03 ENCOUNTER — Ambulatory Visit
Admission: RE | Admit: 2017-06-03 | Discharge: 2017-06-03 | Disposition: A | Discharge status: Home / Self Care | Payer: BC Managed Care – PPO | Source: Ambulatory Visit | Attending: Physician Assistant | Admitting: Physician Assistant

## 2017-06-03 DIAGNOSIS — R52 Pain, unspecified: Secondary | ICD-10-CM

## 2019-05-01 ENCOUNTER — Ambulatory Visit: Payer: Medicare PPO | Attending: Internal Medicine

## 2019-05-01 DIAGNOSIS — Z23 Encounter for immunization: Secondary | ICD-10-CM | POA: Insufficient documentation

## 2019-05-01 NOTE — Progress Notes (Signed)
   Covid-19 Vaccination Clinic  Name:  Deanna Erickson    MRN: [id number] DOB: 18-Jun-1945  05/01/2019  [name] was observed post Covid-19 immunization for 15 minutes without incidence. She was provided with Vaccine Information Sheet and instruction to access the V-Safe system.   [name] was instructed to call 911 with any severe reactions post vaccine: Marland Kitchen Difficulty breathing  . Swelling of your face and throat  . A fast heartbeat  . A bad rash all over your body  . Dizziness and weakness    Immunizations Administered    Name Date Dose VIS Date Route   Pfizer COVID-19 Vaccine 05/01/2019  3:49 PM 0.3 mL 03/23/2019 Intramuscular   Manufacturer: [employer], [employer]   Lot: [id number]   NDC: [address]-8

## 2019-05-19 ENCOUNTER — Ambulatory Visit: Payer: Medicare PPO | Attending: Internal Medicine

## 2019-05-19 DIAGNOSIS — Z23 Encounter for immunization: Secondary | ICD-10-CM | POA: Insufficient documentation

## 2019-05-19 NOTE — Progress Notes (Signed)
   Covid-19 Vaccination Clinic  Name:  SARAIYA KOZMA    MRN: [id number] DOB: [birth year]  05/19/2019  Ms. Mcgovern was observed post Covid-19 immunization for 15 minutes without incidence. She was provided with Vaccine Information Sheet and instruction to access the V-Safe system.   Ms. Kesecker was instructed to call 911 with any severe reactions post vaccine: Marland Kitchen Difficulty breathing  . Swelling of your face and throat  . A fast heartbeat  . A bad rash all over your body  . Dizziness and weakness    Immunizations Administered    Name Date Dose VIS Date Route   Pfizer COVID-19 Vaccine 05/19/2019 10:37 AM 0.3 mL 03/23/2019 Intramuscular   Manufacturer: [employer], [employer]   Lot: [id number]   NDC: [address]-8

## 2019-07-17 ENCOUNTER — Encounter: Payer: Self-pay | Admitting: Gastroenterology

## 2019-09-21 ENCOUNTER — Other Ambulatory Visit: Payer: Self-pay

## 2019-09-21 ENCOUNTER — Ambulatory Visit (AMBULATORY_SURGERY_CENTER): Payer: Self-pay | Admitting: *Deleted

## 2019-09-21 VITALS — Ht 66.0 in | Wt 159.0 lb

## 2019-09-21 DIAGNOSIS — Z1211 Encounter for screening for malignant neoplasm of colon: Secondary | ICD-10-CM

## 2019-09-21 MED ORDER — SUTAB 1479-225-188 MG PO TABS: 24.0000 | ORAL_TABLET | ORAL | 0 refills | Status: DC

## 2019-09-21 NOTE — Progress Notes (Signed)
05-19-19 comp covid vacc  No egg or soy allergy known to patient  issues with past sedation with any surgeries  or procedures- PONV in the past , no intubation problems  No diet pills per patient No home 02 use per patient  No blood thinners per patient  Pt denies issues with constipation  No A fib or A flutter  EMMI video sent to pt's e mail   Due to the COVID-19 pandemic we are asking patients to follow these guidelines. Please only bring one care partner. Please be aware that your care partner may wait in the car in the parking lot or if they feel like they will be too hot to wait in the car, they may wait in the lobby on the 4th floor. All care partners are required to wear a mask the entire time (we do not have any that we can provide them), they need to practice social distancing, and we will do a Covid check for all patient's and care partners when you arrive. Also we will check their temperature and your temperature. If the care partner waits in their car they need to stay in the parking lot the entire time and we will call them on their cell phone when the patient is ready for discharge so they can bring the car to the front of the building. Also all patient's will need to wear a mask into building.

## 2019-10-10 ENCOUNTER — Ambulatory Visit (AMBULATORY_SURGERY_CENTER): Payer: Medicare Other | Admitting: Gastroenterology

## 2019-10-10 ENCOUNTER — Other Ambulatory Visit: Payer: Self-pay

## 2019-10-10 ENCOUNTER — Encounter: Payer: Self-pay | Admitting: Gastroenterology

## 2019-10-10 VITALS — BP 119/57 | HR 65 | Temp 97.3°F | Resp 10 | Ht 66.0 in | Wt 159.0 lb

## 2019-10-10 DIAGNOSIS — Z1211 Encounter for screening for malignant neoplasm of colon: Secondary | ICD-10-CM | POA: Diagnosis not present

## 2019-10-10 MED ORDER — SODIUM CHLORIDE 0.9 % IV SOLN: 500.0000 mL | Freq: Once | INTRAVENOUS | Status: DC

## 2019-10-10 NOTE — Op Note (Signed)
[facility] Patient Name: Deanna Erickson Procedure Date: 10/10/2019 9:06 AM MRN: [id number] Endoscopist: Napoleon Form , MD Age: 74 Referring MD:  Date of Birth: 10-02-1945 Gender: Female Account #: [id number] Procedure:                Colonoscopy Indications:              Screening for colorectal malignant neoplasm Medicines:                Monitored Anesthesia Care Procedure:                Pre-Anesthesia Assessment:                           - Prior to the procedure, a History and Physical                            was performed, and patient medications and                            allergies were reviewed. The patient's tolerance of                            previous anesthesia was also reviewed. The risks                            and benefits of the procedure and the sedation                            options and risks were discussed with the patient.                            All questions were answered, and informed consent                            was obtained. Prior Anticoagulants: The patient has                            taken no previous anticoagulant or antiplatelet                            agents. ASA Grade Assessment: II - A patient with                            mild systemic disease. After reviewing the risks                            and benefits, the patient was deemed in                            satisfactory condition to undergo the procedure.                           After obtaining informed consent, the colonoscope  was passed under direct vision. Throughout the                            procedure, the patient's blood pressure, pulse, and                            oxygen saturations were monitored continuously. The                            Colonoscope was introduced through the anus and                            advanced to the the cecum, identified by                            appendiceal orifice  and ileocecal valve. The                            colonoscopy was performed without difficulty. The                            patient tolerated the procedure well. The quality                            of the bowel preparation was excellent. The                            ileocecal valve, appendiceal orifice, and rectum                            were photographed. Scope In: 9:13:19 AM Scope Out: 9:24:11 AM Scope Withdrawal Time: 0 hours 7 minutes 23 seconds  Total Procedure Duration: 0 hours 10 minutes 52 seconds  Findings:                 The perianal and digital rectal examinations were                            normal.                           Non-bleeding internal hemorrhoids were found during                            retroflexion. The hemorrhoids were small.                           The exam was otherwise without abnormality. Complications:            No immediate complications. Estimated Blood Loss:     Estimated blood loss: none. Impression:               - Non-bleeding internal hemorrhoids.                           - The examination was otherwise normal.                           -  No specimens collected. Recommendation:           - Patient has a contact number available for                            emergencies. The signs and symptoms of potential                            delayed complications were discussed with the                            patient. Return to normal activities tomorrow.                            Written discharge instructions were provided to the                            patient.                           - Resume previous diet.                           - Continue present medications.                           - No repeat colonoscopy due to age, the absence of                            colonic polyps and no evidence of mucosal or other                            abnormalities on today's exam.                           - Return to GI  clinic PRN. Napoleon Form, MD 10/10/2019 9:31:45 AM This report has been signed electronically.

## 2019-10-10 NOTE — Progress Notes (Signed)
VS by CW  Pt's states no medical or surgical changes since previsit or office visit.  

## 2019-10-10 NOTE — Progress Notes (Signed)
Report to PACU, RN, vss, BBS= Clear.  

## 2019-10-10 NOTE — Patient Instructions (Signed)
Handout provided on hemorrhoids.   YOU HAD AN ENDOSCOPIC PROCEDURE TODAY AT THE [facility]:   Refer to the procedure report that was given to you for any specific questions about what was found during the examination.  If the procedure report does not answer your questions, please call your gastroenterologist to clarify.  If you requested that your care partner not be given the details of your procedure findings, then the procedure report has been included in a sealed envelope for you to review at your convenience later.  YOU SHOULD EXPECT: Some feelings of bloating in the abdomen. Passage of more gas than usual.  Walking can help get rid of the air that was put into your GI tract during the procedure and reduce the bloating. If you had a lower endoscopy (such as a colonoscopy or flexible sigmoidoscopy) you may notice spotting of blood in your stool or on the toilet paper. If you underwent a bowel prep for your procedure, you may not have a normal bowel movement for a few days.  Please Note:  You might notice some irritation and congestion in your nose or some drainage.  This is from the oxygen used during your procedure.  There is no need for concern and it should clear up in a day or so.  SYMPTOMS TO REPORT IMMEDIATELY:  Following lower endoscopy (colonoscopy or flexible sigmoidoscopy):  Excessive amounts of blood in the stool  Significant tenderness or worsening of abdominal pains  Swelling of the abdomen that is new, acute  Fever of 100F or higher  For urgent or emergent issues, a gastroenterologist can be reached at any hour by calling [phone]. Do not use MyChart messaging for urgent concerns.    DIET:  We do recommend a small meal at first, but then you may proceed to your regular diet.  Drink plenty of fluids but you should avoid alcoholic beverages for 24 hours.  ACTIVITY:  You should plan to take it easy for the rest of today and you should NOT DRIVE or use heavy  machinery until tomorrow (because of the sedation medicines used during the test).    FOLLOW UP: Our staff will call the number listed on your records 48-72 hours following your procedure to check on you and address any questions or concerns that you may have regarding the information given to you following your procedure. If we do not reach you, we will leave a message.  We will attempt to reach you two times.  During this call, we will ask if you have developed any symptoms of COVID 19. If you develop any symptoms (ie: fever, flu-like symptoms, shortness of breath, cough etc.) before then, please call [phone].  If you test positive for Covid 19 in the 2 weeks post procedure, please call and report this information to us.    If any biopsies were taken you will be contacted by phone or by letter within the next 1-3 weeks.  Please call us at [phone] if you have not heard about the biopsies in 3 weeks.    SIGNATURES/CONFIDENTIALITY: You and/or your care partner have signed paperwork which will be entered into your electronic medical record.  These signatures attest to the fact that that the information above on your After Visit Summary has been reviewed and is understood.  Full responsibility of the confidentiality of this discharge information lies with you and/or your care-partner.  

## 2019-10-12 ENCOUNTER — Telehealth: Payer: Self-pay

## 2019-10-12 NOTE — Telephone Encounter (Signed)
  Follow up Call-  Call back number 10/10/2019  Post procedure Call Back phone  # [phone]  Permission to leave phone message Yes  Some recent data might be hidden     Patient questions:  Do you have a fever, pain , or abdominal swelling? No. Pain Score  0 *  Have you tolerated food without any problems? Yes.    Have you been able to return to your normal activities? Yes.    Do you have any questions about your discharge instructions: Diet   No. Medications  No. Follow up visit  No.  Do you have questions or concerns about your Care? No.  Actions: * If pain score is 4 or above: No action needed, pain <4.  1. Have you developed a fever since your procedure? no  2.   Have you had an respiratory symptoms (SOB or cough) since your procedure? no  3.   Have you tested positive for COVID 19 since your procedure no  4.   Have you had any family members/close contacts diagnosed with the COVID 19 since your procedure?  no   If yes to any of these questions please route to Laverna Peace, RN and Charlett Lango, RN

## 2019-12-03 IMAGING — US US EXTREM LOW VENOUS*R*
1 series · 13 of 24 positions shown · non-contrast
Comparison: None.

CLINICAL DATA: 71-year-old female with right lower extremity pain



[Series 1: us extrem low venous*right* · 0.07mm/px · 13 of 48 slices shown]
[im 1/48]
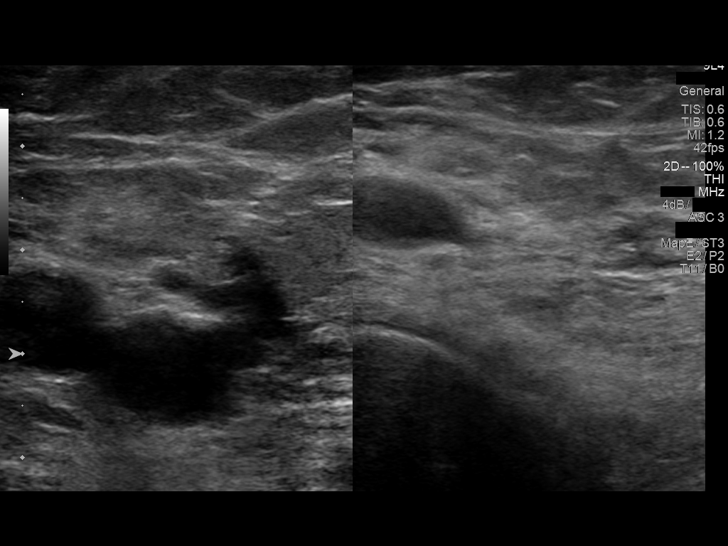
[im 5/48]
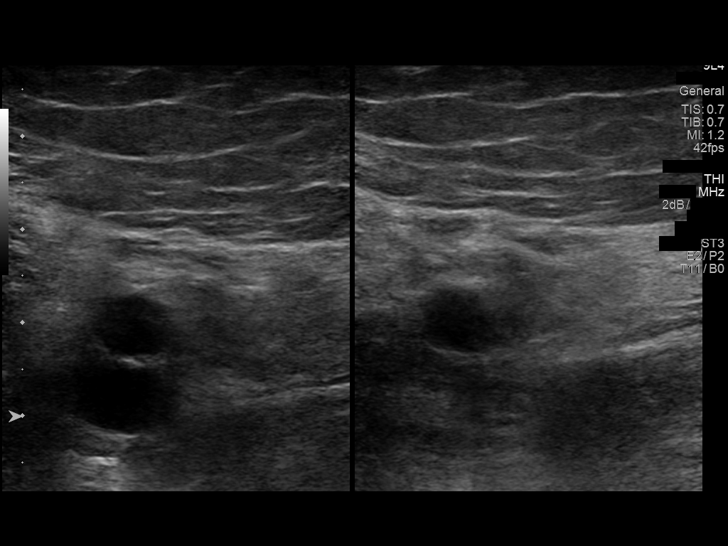
[im 9/48]
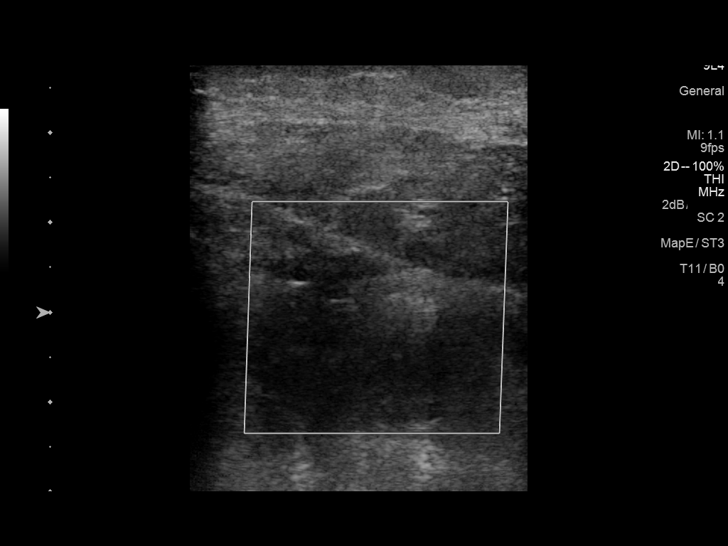
[im 13/48]
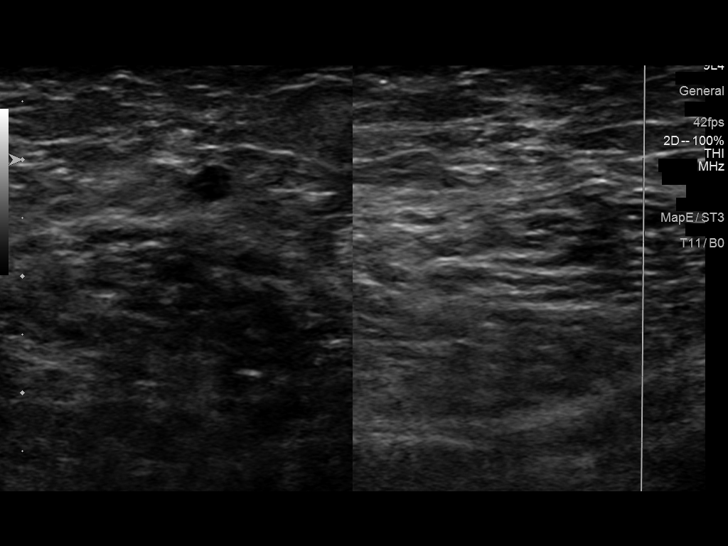
[im 17/48]
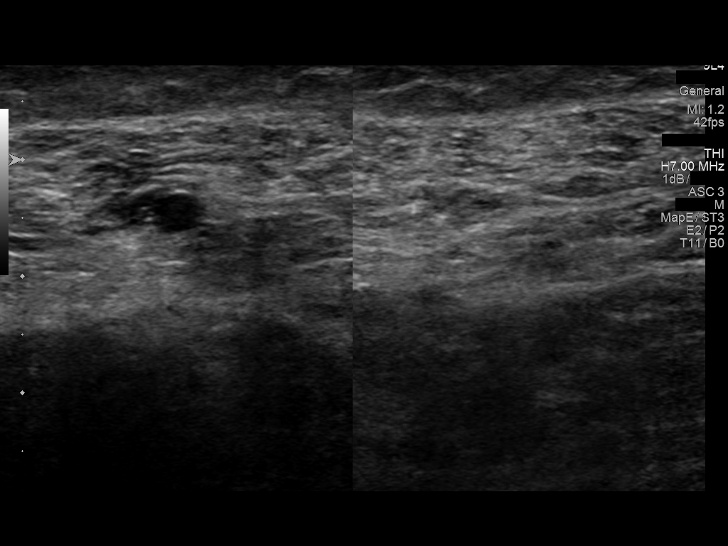
[im 21/48]
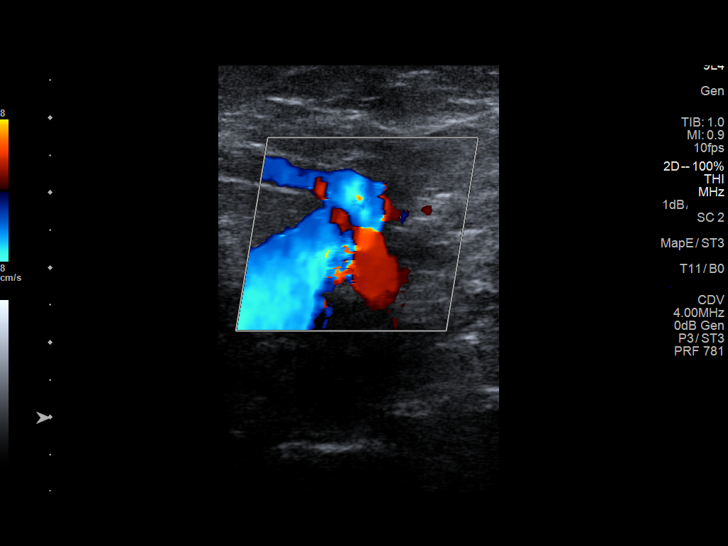
[im 25/48]
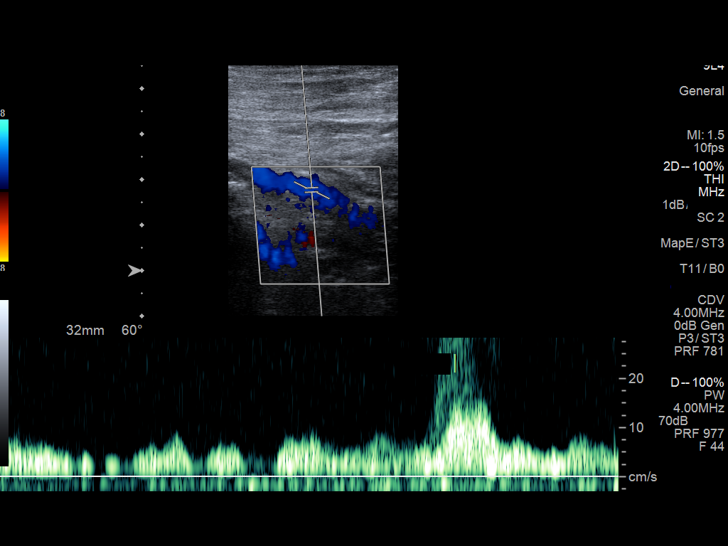
[im 27/48]
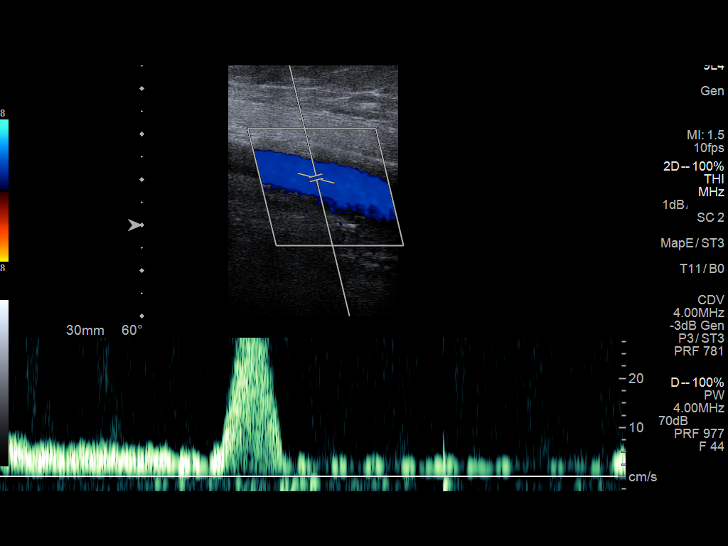
[im 31/48]
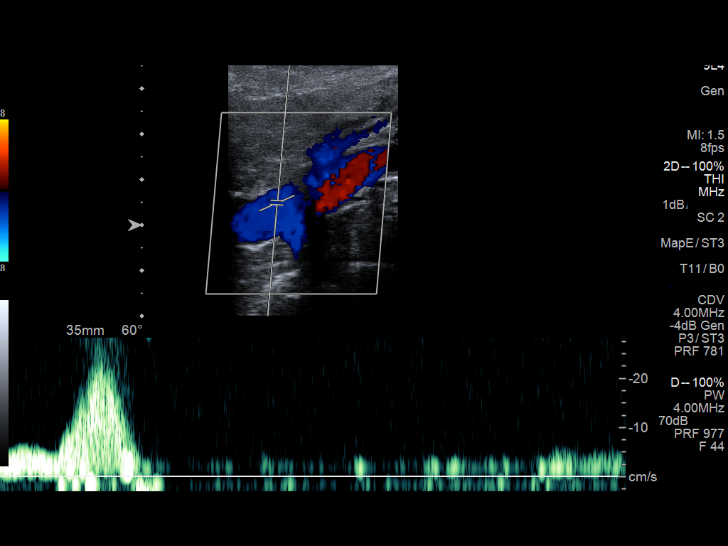
[im 35/48]
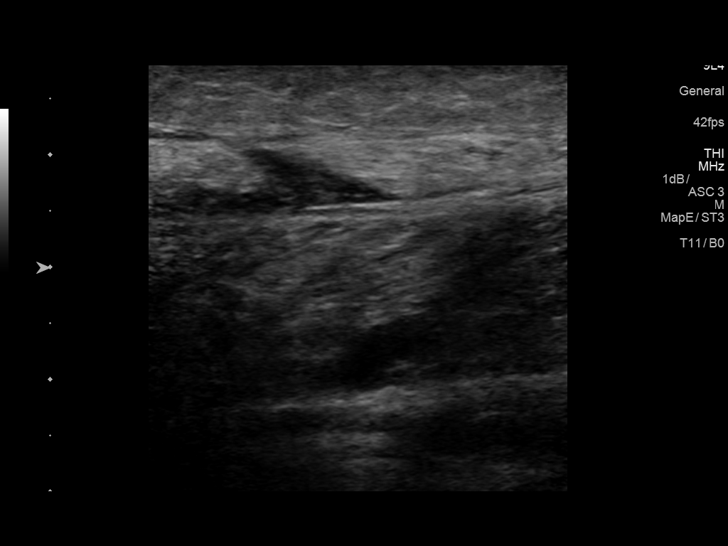
[im 39/48]
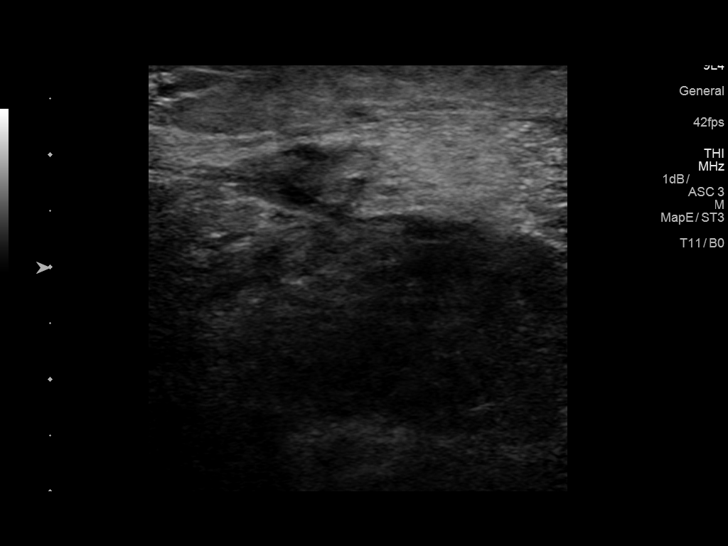
[im 43/48]
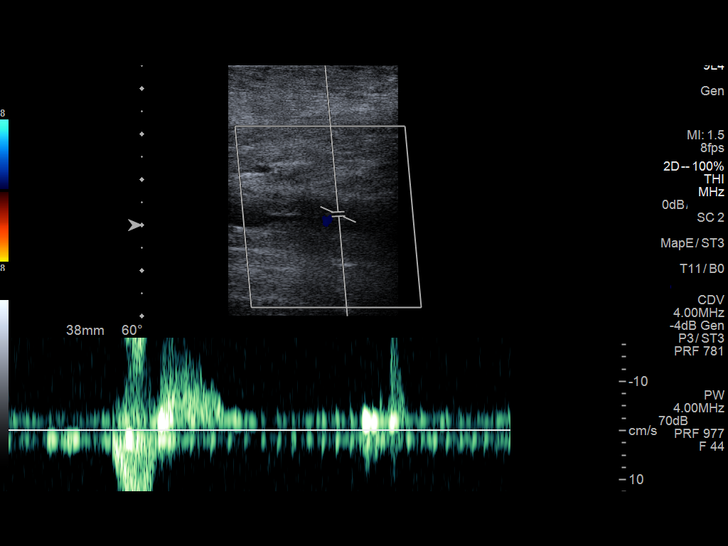
[im 48/48]
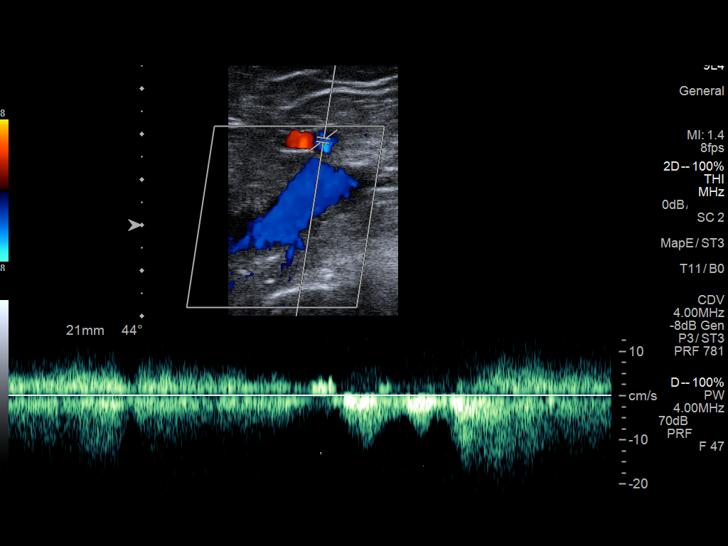

[13 of 24 positions shown; findings below may reference images not displayed]

FINDINGS: Contralateral Common Femoral Vein: Respiratory phasicity is normal
and symmetric with the symptomatic side. No evidence of thrombus.
Normal compressibility.

Common Femoral Vein: No evidence of thrombus. Normal
compressibility, respiratory phasicity and response to augmentation.

Saphenofemoral Junction: No evidence of thrombus. Normal
compressibility and flow on color Doppler imaging.

Profunda Femoral Vein: No evidence of thrombus. Normal
compressibility and flow on color Doppler imaging.

Femoral Vein: No evidence of thrombus. Normal compressibility,
respiratory phasicity and response to augmentation.

Popliteal Vein: No evidence of thrombus. Normal compressibility,
respiratory phasicity and response to augmentation.

Calf Veins: Limited visualization of the calf veins. No definite
DVT.

Superficial Great Saphenous Vein: No evidence of thrombus. Normal
compressibility.

Venous Reflux:  None.

Other Findings:  None.
IMPRESSION: No evidence of deep venous thrombosis to the level of the knee.
Limited visualization of the calf veins. No definite calf vein DVT
identified.

## 2020-12-12 ENCOUNTER — Other Ambulatory Visit: Payer: Self-pay | Admitting: Plastic Surgery

## 2022-03-01 ENCOUNTER — Other Ambulatory Visit: Payer: Self-pay

## 2022-03-01 ENCOUNTER — Emergency Department (HOSPITAL_COMMUNITY)
Admission: EM | Admit: 2022-03-01 | Discharge: 2022-03-01 | Disposition: A | Discharge status: Home / Self Care | Payer: Medicare Other | Attending: Emergency Medicine | Admitting: Emergency Medicine

## 2022-03-01 ENCOUNTER — Encounter (HOSPITAL_COMMUNITY): Payer: Self-pay | Admitting: *Deleted

## 2022-03-01 DIAGNOSIS — Z79899 Other long term (current) drug therapy: Secondary | ICD-10-CM | POA: Insufficient documentation

## 2022-03-01 DIAGNOSIS — Z7982 Long term (current) use of aspirin: Secondary | ICD-10-CM | POA: Diagnosis not present

## 2022-03-01 DIAGNOSIS — E039 Hypothyroidism, unspecified: Secondary | ICD-10-CM | POA: Diagnosis not present

## 2022-03-01 DIAGNOSIS — R030 Elevated blood-pressure reading, without diagnosis of hypertension: Secondary | ICD-10-CM | POA: Diagnosis present

## 2022-03-01 DIAGNOSIS — I10 Essential (primary) hypertension: Secondary | ICD-10-CM | POA: Diagnosis not present

## 2022-03-01 LAB — CBC WITH DIFFERENTIAL/PLATELET
Abs Immature Granulocytes: 0.03 10*3/uL (ref 0.00–0.07)
Basophils Absolute: 0.1 10*3/uL (ref 0.0–0.1)
Basophils Relative: 1 %
Eosinophils Absolute: 0.2 10*3/uL (ref 0.0–0.5)
Eosinophils Relative: 2 %
HCT: 43 % (ref 36.0–46.0)
Hemoglobin: 14.3 g/dL (ref 12.0–15.0)
Immature Granulocytes: 0 %
Lymphocytes Relative: 26 %
Lymphs Abs: 2.2 10*3/uL (ref 0.7–4.0)
MCH: 30.9 pg (ref 26.0–34.0)
MCHC: 33.3 g/dL (ref 30.0–36.0)
MCV: 92.9 fL (ref 80.0–100.0)
Monocytes Absolute: 0.6 10*3/uL (ref 0.1–1.0)
Monocytes Relative: 7 %
Neutro Abs: 5.5 10*3/uL (ref 1.7–7.7)
Neutrophils Relative %: 64 %
Platelets: 275 10*3/uL (ref 150–400)
RBC: 4.63 MIL/uL (ref 3.87–5.11)
RDW: 13.2 % (ref 11.5–15.5)
WBC: 8.6 10*3/uL (ref 4.0–10.5)
nRBC: 0 % (ref 0.0–0.2)

## 2022-03-01 LAB — BASIC METABOLIC PANEL
Anion gap: 8 (ref 5–15)
BUN: 13 mg/dL (ref 8–23)
CO2: 24 mmol/L (ref 22–32)
Calcium: 9.6 mg/dL (ref 8.9–10.3)
Chloride: 105 mmol/L (ref 98–111)
Creatinine, Ser: 0.79 mg/dL (ref 0.44–1.00)
GFR, Estimated: >60 mL/min (ref >60)
Glucose, Bld: 121 mg/dL — ABNORMAL HIGH (ref 70–99)
Potassium: 3.8 mmol/L (ref 3.5–5.1)
Sodium: 137 mmol/L (ref 135–145)

## 2022-03-01 NOTE — ED Provider Notes (Signed)
[facility] EMERGENCY DEPARTMENT Provider Note   CSN: [id number] Arrival date & time: 03/01/22  2000     History  Chief Complaint  Patient presents with   Hypertension    Deanna Erickson is a 76 y.o. female.  With history of hypertension, hypothyroidism, and hyperlipidemia who presents ED for evaluation of asymptomatic hypertension.  She states that her blood pressures typically run approximately 130 systolic over approximately 90 diastolic.  She took her blood pressure today and it was 210/100.  She states that her blood pressure was elevated despite antihypertensives in August and her medication doses had to be increased.  Currently denying chest pain, shortness of breath, abdominal pain, nausea, vomiting, diarrhea, dizziness, lightheadedness, numbness, weakness, tingling.   Hypertension       Home Medications Prior to Admission medications   Medication Sig Start Date End Date Taking? Authorizing Provider  aspirin 81 MG tablet Take 81 mg by mouth daily.    [provider]  Calcium Carbonate (CALTRATE 600 PO) Take by mouth.    [provider]  cetirizine (ZYRTEC) 10 MG tablet Take 10 mg by mouth every evening.  Patient not taking: Reported on 09/21/2019    [provider]  Cholecalciferol (VITAMIN D3) 1000 UNITS CAPS Take by mouth.    [provider]  loratadine (CLARITIN) 10 MG tablet Take 10 mg by mouth daily. Patient not taking: Reported on 09/21/2019    [provider]  methimazole (TAPAZOLE) 5 MG tablet Take 2.5-5 mg by mouth daily. 2.5mg  on Sun,Mon,Wed,Fri 5mg  on Tu,Th,Sat    [provider]  metoprolol succinate (TOPROL-XL) 25 MG 24 hr tablet Take 25 mg by mouth at bedtime. Original med rec noted strength as 50mg , patient states now dose is 25mg     [provider]  Multiple Vitamins-Minerals (CENTRUM SPECIALIST HEART PO) Take by mouth.    [provider]  olmesartan (BENICAR) 20 MG tablet Take 20 mg by  mouth every morning.     [provider]  Omega-3 Fatty Acids (FISH OIL) 300 MG CAPS Take by mouth.    [provider]  omeprazole (PRILOSEC) 20 MG capsule Take 20 mg by mouth daily.    [provider]  rosuvastatin (CRESTOR) 10 MG tablet SMARTSIG:0.5 Tablet(s) By Mouth Every Other Day 08/11/19   [provider]      Allergies    Ace inhibitors, Nsaids, Saline, and Tolmetin    Review of Systems   Review of Systems  All other systems reviewed and are negative.   Physical Exam Updated Vital Signs BP (!) 171/80   Pulse (!) 58   Temp 97.8 F (36.6 C) (Oral)   Resp 12   Ht 5\' 6"  (1.676 m)   Wt 72.6 kg   SpO2 100%   BMI 25.82 kg/m  Physical Exam Vitals and nursing note reviewed.  Constitutional:      General: She is not in acute distress.    Appearance: Normal appearance. She is well-developed. She is toxic-appearing. She is not ill-appearing or diaphoretic.  HENT:     Head: Normocephalic and atraumatic.     Mouth/Throat:     Mouth: Mucous membranes are moist.     Pharynx: Oropharynx is clear.  Eyes:     Extraocular Movements: Extraocular movements intact.     Conjunctiva/sclera: Conjunctivae normal.     Pupils: Pupils are equal, round, and reactive to light.  Cardiovascular:     Rate and Rhythm: Normal rate and regular rhythm.  Heart sounds: No murmur heard. Pulmonary:     Effort: Pulmonary effort is normal. No respiratory distress.     Breath sounds: Normal breath sounds. No stridor. No wheezing, rhonchi or rales.  Abdominal:     Palpations: Abdomen is soft.     Tenderness: There is no abdominal tenderness. There is guarding. There is no rebound.  Musculoskeletal:        General: No swelling.     Cervical back: Normal range of motion and neck supple.  Skin:    General: Skin is warm and dry.     Capillary Refill: Capillary refill takes less than 2 seconds.  Neurological:     General: No focal deficit present.     Mental Status:  She is alert and oriented to person, place, and time.     Comments: No unilateral or global weakness.  No facial asymmetry.  No pronator drift.  Normal heel-to-shin and finger-to-nose.  Sensation intact.  Psychiatric:        Mood and Affect: Mood normal.     ED Results / Procedures / Treatments   Labs (all labs ordered are listed, but only abnormal results are displayed) Labs Reviewed  BASIC METABOLIC PANEL - Abnormal; Notable for the following components:      Result Value   Glucose, Bld 121 (*)    All other components within normal limits  CBC WITH DIFFERENTIAL/PLATELET    EKG None  Radiology No results found.  Procedures Procedures    Medications Ordered in ED Medications - No data to display  ED Course/ Medical Decision Making/ A&P                           Medical Decision Making Amount and/or Complexity of Data Reviewed Labs: ordered.  This patient presents to the ED for concern of high blood pressure, this involves an extensive number of treatment options, and is a complaint that carries with it a high risk of complications and morbidity.  The differential diagnosis for hypertension includes but is not limited to hypertensive emergency, hypertensive urgency, stroke, sympathomimetic ingestion, acute pulmonary edema, ischemic stroke, intracranial hemorrhage, preeclampsia or eclampsia, autonomic dysreflexia, acute glomerulonephritis, type I MI, volume overload, urinary obstruction, pain, renal artery stenosis, polycystic kidney disease, Cushing syndrome, OSA, pheochromocytoma, hyperaldosteronism, hypothyroidism, anxiety    Co morbidities that complicate the patient evaluation   hypertension  My initial workup includes basic labs, EKG  Additional history obtained from: Nursing notes from this visit.  I ordered, reviewed and interpreted labs which include: BMP, CBC.  All normal  Cardiac Monitoring:  The patient was maintained on a cardiac monitor.  I personally  viewed and interpreted the cardiac monitored which showed an underlying rhythm of: NSR  Afebrile, hypertensive at approximately 57/4.  76 year old female presenting to the ED for evaluation of asymptomatic hypertension.  Physical exam including neurologic exam unremarkable.  Labs unremarkable.  Patient has recently had to increase the dose of her blood pressure medicine due to hypertensive readings.  There is no evidence of endorgan damage in the ED.  Her blood pressure has continued to decrease while in the ED.  Patient was encouraged to follow-up with her primary care provider within the next week.  Gave patient information regarding blood pressure management.  Gave patient strict return precautions.  Stable at discharge.  At this time there does not appear to be any evidence of an acute emergency medical condition and the patient appears stable  for discharge with appropriate outpatient follow up. Diagnosis was discussed with patient who verbalizes understanding of care plan and is agreeable to discharge. I have discussed return precautions with patient and husband who verbalizes understanding. Patient encouraged to follow-up with their PCP within 1 week. All questions answered.  Patient's case discussed with Dr. Renaye Rakers who agrees with plan to discharge with follow-up.   Note: Portions of this report may have been transcribed using voice recognition software. Every effort was made to ensure accuracy; however, inadvertent computerized transcription errors may still be present.          Final Clinical Impression(s) / ED Diagnoses Final diagnoses:  None    Rx / DC Orders ED Discharge Orders     None         Michelle Piper, Cordelia Poche 03/01/22 2252    Terald Sleeper, MD 03/01/22 [phone]

## 2022-03-01 NOTE — ED Notes (Signed)
Pt ambulated to the restroom and back to the room with no complaints.

## 2022-03-01 NOTE — ED Triage Notes (Signed)
Pt hypertensive at home tonight.  Denies any CP or HA.

## 2022-03-01 NOTE — Discharge Instructions (Signed)
You have been seen today for your complaint of high blood pressure. Your lab work was reassuring and showed no abnormalities. Home care instructions are as follows:  You should take your blood pressure first thing in the morning before you get out of bed if possible Follow up with: Your primary care provider within 1 week Please seek immediate medical care if you develop any of the following symptoms: You develop a severe headache or confusion. You have unusual weakness or numbness, or you feel faint. You have severe pain in your chest or abdomen. You vomit repeatedly. You have trouble breathing. At this time there does not appear to be the presence of an emergent medical condition, however there is always the potential for conditions to change. Please read and follow the below instructions.  Do not take your medicine if  develop an itchy rash, swelling in your mouth or lips, or difficulty breathing; call 911 and seek immediate emergency medical attention if this occurs.  You may review your lab tests and imaging results in their entirety on your MyChart account.  Please discuss all results of fully with your primary care provider and other specialist at your follow-up visit.  Note: Portions of this text may have been transcribed using voice recognition software. Every effort was made to ensure accuracy; however, inadvertent computerized transcription errors may still be present.

## 2024-05-04 ENCOUNTER — Emergency Department (HOSPITAL_COMMUNITY)
Admission: EM | Admit: 2024-05-04 | Discharge: 2024-05-04 | Disposition: A | Discharge status: Home / Self Care | Attending: Emergency Medicine | Admitting: Emergency Medicine

## 2024-05-04 ENCOUNTER — Encounter (HOSPITAL_COMMUNITY): Payer: Self-pay

## 2024-05-04 ENCOUNTER — Other Ambulatory Visit: Payer: Self-pay

## 2024-05-04 DIAGNOSIS — R42 Dizziness and giddiness: Secondary | ICD-10-CM

## 2024-05-04 DIAGNOSIS — Z79899 Other long term (current) drug therapy: Secondary | ICD-10-CM | POA: Insufficient documentation

## 2024-05-04 DIAGNOSIS — Z7982 Long term (current) use of aspirin: Secondary | ICD-10-CM | POA: Diagnosis not present

## 2024-05-04 DIAGNOSIS — I10 Essential (primary) hypertension: Secondary | ICD-10-CM | POA: Diagnosis not present

## 2024-05-04 DIAGNOSIS — E860 Dehydration: Secondary | ICD-10-CM | POA: Insufficient documentation

## 2024-05-04 LAB — CBC WITH DIFFERENTIAL/PLATELET
Abs Immature Granulocytes: 0.02 K/uL (ref 0.00–0.07)
Basophils Absolute: 0 K/uL (ref 0.0–0.1)
Basophils Relative: 1 %
Eosinophils Absolute: 0.1 K/uL (ref 0.0–0.5)
Eosinophils Relative: 2 %
HCT: 38.8 % (ref 36.0–46.0)
Hemoglobin: 13.3 g/dL (ref 12.0–15.0)
Immature Granulocytes: 0 %
Lymphocytes Relative: 20 %
Lymphs Abs: 1.3 K/uL (ref 0.7–4.0)
MCH: 31.6 pg (ref 26.0–34.0)
MCHC: 34.3 g/dL (ref 30.0–36.0)
MCV: 92.2 fL (ref 80.0–100.0)
Monocytes Absolute: 0.6 K/uL (ref 0.1–1.0)
Monocytes Relative: 10 %
Neutro Abs: 4.1 K/uL (ref 1.7–7.7)
Neutrophils Relative %: 67 %
Platelets: 244 K/uL (ref 150–400)
RBC: 4.21 MIL/uL (ref 3.87–5.11)
RDW: 12.4 % (ref 11.5–15.5)
WBC: 6.2 K/uL (ref 4.0–10.5)
nRBC: 0 % (ref 0.0–0.2)

## 2024-05-04 LAB — COMPREHENSIVE METABOLIC PANEL WITH GFR
ALT: 23 U/L (ref 0–44)
AST: 21 U/L (ref 15–41)
Albumin: 4.3 g/dL (ref 3.5–5.0)
Alkaline Phosphatase: 42 U/L (ref 38–126)
Anion gap: 10 (ref 5–15)
BUN: 10 mg/dL (ref 8–23)
CO2: 27 mmol/L (ref 22–32)
Calcium: 9.6 mg/dL (ref 8.9–10.3)
Chloride: 95 mmol/L — ABNORMAL LOW (ref 98–111)
Creatinine, Ser: 0.7 mg/dL (ref 0.44–1.00)
GFR, Estimated: >60 mL/min
Glucose, Bld: 109 mg/dL — ABNORMAL HIGH (ref 70–99)
Potassium: 4.5 mmol/L (ref 3.5–5.1)
Sodium: 132 mmol/L — ABNORMAL LOW (ref 135–145)
Total Bilirubin: 0.5 mg/dL (ref 0.0–1.2)
Total Protein: 6.4 g/dL — ABNORMAL LOW (ref 6.5–8.1)

## 2024-05-04 NOTE — Telephone Encounter (Signed)
 Copied from CRM #[id number]. Topic: Medication/Rx - Rx Clarification/Change  >> May 04, 2024 10:01 AM Ginger GRADE wrote: Deanna Erickson, Deanna Erickson is calling other request    Include all details related to the request(s) below:  Husband called to obtain verbally the list of his wife's blood pressure medications. Stated she is in the hospital and that he needed the list of 3 blood pressure medications. Read him the list of medications as listed in the chart with medication name, dosage and instructions. Husband verbalized understanding and did not have further questions.   Confirm and type the Best Contact Number below:  Patient/caller contact number: [phone]            [] Home  [x] Mobile  [] Work [] Other   [] Okay to leave a voicemail   Medication List:  Current Outpatient Medications:    aspirin 81 mg EC tablet, Take  by mouth., Disp: , Rfl:    calcium carbonate (OS-CAL) 1500 mg (600 mg calcium) tablet, Take  by mouth., Disp: , Rfl:    cholecalciferol 25 mcg (1,000 unit) cap, Take  by mouth., Disp: , Rfl:    hydroCHLOROthiazide (HYDRODIURIL) 12.5 mg tablet, Take 1 tablet (12.5 mg total) by mouth daily., Disp: 90 tablet, Rfl: 3   meclizine (ANTIVERT) 25 mg tablet, Take 1 tablet (25 mg total) by mouth every 12 (twelve) hours as needed for dizziness., Disp: 20 tablet, Rfl: 0   methIMAzole  (TAPAZOLE ) 5 mg tablet, TAKE 1 TABLET BY MOUTH ONE DAY PER WEEK, THEN 1/2 A TABLET ON ALL OTHER DAYS, Disp: 51 tablet, Rfl: 0   metoprolol  succinate (TOPROL  XL) 50 mg 24 hr tablet, Take 1 tablet (50 mg total) by mouth daily., Disp: 90 tablet, Rfl: 3   multivitamin,tx-minerals (Super Thera Vite M) tab, Take  by mouth., Disp: , Rfl:    olmesartan (BENICAR) 40 mg tablet, Take 1 tablet (40 mg total) by mouth daily., Disp: 90 tablet, Rfl: 1   omega-3 fatty acids-fish oil (Fish OiL) 300-500 mg cap, Take 1 capsule by mouth Once Daily., Disp: , Rfl:    omeprazole (PriLOSEC) 20 mg DR capsule, Take 20 mg by mouth Once  Daily., Disp: 90 capsule, Rfl: 3   rosuvastatin (CRESTOR) 10 mg tablet, TAKE 1/2 TABLET BY MOUTH EVERY OTHER DAY AT BEDTIME, Disp: 45 tablet, Rfl: 1     Medication Request/Refills: Pharmacy Information (if applicable)   [x] Not Applicable       []  Pharmacy listed  Send Medication Request to:                                                 [] Pharmacy not listed (added to pharmacy list in Epic) Send Medication Request to:      Listed Pharmacies: [facility] [id number] - [place], [place] - [address] AT [facility] OF [address] - PHONE: [phone] - FAX: [phone] [facility] [phone] - [place], [place] - [address] AT [address] OF [address] - PHONE: [phone] - FAX: [phone]

## 2024-05-04 NOTE — ED Triage Notes (Signed)
 Pt came in pov with complaints of hypertension, tremors and dizziness. Pt has a history of hypertension and is on medication. The tremors have been going on for a while and the dizziness started this morning. Pt states that she gets this way when her bp is up.

## 2024-05-04 NOTE — ED Notes (Signed)
 Pt takes hydrochlorothiazide 12.5 mg daily , metoprolol  succinate 50 mg daily , olnesartan 40 mg daily

## 2024-05-04 NOTE — Discharge Instructions (Signed)
 Stop taking your HCTZ.  You usually take that medicine the middle part of the day.  Increase your nighttime blood pressure medicine called metoprolol  or Toprol  so you are taking 2 pills a day and continue taking your morning dose as you have been.  Stay hydrated and follow-up with your family doctor in 2 weeks.  If you get worse get seen sooner

## 2024-05-05 ENCOUNTER — Other Ambulatory Visit: Payer: Self-pay

## 2024-05-05 ENCOUNTER — Encounter (HOSPITAL_COMMUNITY): Payer: Self-pay

## 2024-05-05 ENCOUNTER — Emergency Department (HOSPITAL_COMMUNITY)

## 2024-05-05 ENCOUNTER — Emergency Department (HOSPITAL_COMMUNITY)
Admission: EM | Admit: 2024-05-05 | Discharge: 2024-05-05 | Disposition: A | Discharge status: Home / Self Care | Attending: Emergency Medicine | Admitting: Emergency Medicine

## 2024-05-05 DIAGNOSIS — I10 Essential (primary) hypertension: Secondary | ICD-10-CM | POA: Insufficient documentation

## 2024-05-05 DIAGNOSIS — R42 Dizziness and giddiness: Secondary | ICD-10-CM | POA: Diagnosis present

## 2024-05-05 DIAGNOSIS — Z7982 Long term (current) use of aspirin: Secondary | ICD-10-CM | POA: Insufficient documentation

## 2024-05-05 DIAGNOSIS — R030 Elevated blood-pressure reading, without diagnosis of hypertension: Secondary | ICD-10-CM

## 2024-05-05 MED ORDER — LORAZEPAM 0.5 MG PO TABS
0.5000 mg | ORAL_TABLET | Freq: Once | ORAL | Status: AC
  Administered 2024-05-05: 0.5 mg via ORAL
  Filled 2024-05-05: qty 1

## 2024-05-05 NOTE — ED Triage Notes (Signed)
 Pt presents to ED from home C/O dizziness and hypertension. States seen yesterday for same, but dizziness is worse.

## 2024-05-05 NOTE — ED Notes (Signed)
 Pt/family received d/c paperwork at this time. After going over the paperwork any questions, comments, or concerns were answered to the best of this nurse's knowledge. The pt/family verbally acknowledged the teachings/instructions.

## 2024-05-05 NOTE — ED Provider Notes (Signed)
 " [place] EMERGENCY DEPARTMENT AT [facility] Provider Note   CSN: [id number] Arrival date & time: 05/04/24  9271     Patient presents with: Hypertension   Deanna Erickson is a 79 y.o. female.   Patient complains of dizziness.  She has a history of hypertension.  She also complains of minor tremors  The history is provided by the patient and medical records.  Dizziness Quality:  Lightheadedness Severity:  Mild Onset quality:  Sudden Timing:  Intermittent Progression:  Partially resolved Chronicity:  New Relieved by:  Nothing Worsened by:  Nothing Ineffective treatments:  None tried Associated symptoms: no blood in stool, no chest pain, no diarrhea and no headaches   Risk factors: no anemia        Prior to Admission medications  Medication Sig Start Date End Date Taking? Authorizing Provider  aspirin 81 MG tablet Take 81 mg by mouth daily.    [provider]  Calcium Carbonate (CALTRATE 600 PO) Take by mouth.    [provider]  cetirizine (ZYRTEC) 10 MG tablet Take 10 mg by mouth every evening.  Patient not taking: Reported on 09/21/2019    [provider]  Cholecalciferol (VITAMIN D3) 1000 UNITS CAPS Take by mouth.    [provider]  loratadine  (CLARITIN ) 10 MG tablet Take 10 mg by mouth daily. Patient not taking: Reported on 09/21/2019    [provider]  methimazole  (TAPAZOLE ) 5 MG tablet Take 2.5-5 mg by mouth daily. 2.5mg  on Sun,Mon,Wed,Fri 5mg  on Tu,Th,Sat    [provider]  metoprolol  succinate (TOPROL -XL) 25 MG 24 hr tablet Take 25 mg by mouth at bedtime. Original med rec noted strength as 50mg , patient states now dose is 25mg     [provider]  Multiple Vitamins-Minerals (CENTRUM SPECIALIST HEART PO) Take by mouth.    [provider]  olmesartan (BENICAR) 20 MG tablet Take 20 mg by mouth every morning.     [provider]  Omega-3 Fatty Acids (FISH OIL) 300 MG CAPS Take  by mouth.    [provider]  omeprazole (PRILOSEC) 20 MG capsule Take 20 mg by mouth daily.    [provider]  rosuvastatin (CRESTOR) 10 MG tablet SMARTSIG:0.5 Tablet(s) By Mouth Every Other Day 08/11/19   [provider]    Allergies: Ace inhibitors, Nsaids, Saline, and Tolmetin    Review of Systems  Constitutional:  Negative for appetite change and fatigue.  HENT:  Negative for congestion, ear discharge and sinus pressure.   Eyes:  Negative for discharge.  Respiratory:  Negative for cough.   Cardiovascular:  Negative for chest pain.  Gastrointestinal:  Negative for abdominal pain, blood in stool and diarrhea.  Genitourinary:  Negative for frequency and hematuria.  Musculoskeletal:  Negative for back pain.  Skin:  Negative for rash.  Neurological:  Positive for dizziness. Negative for seizures and headaches.  Psychiatric/Behavioral:  Negative for hallucinations.     Updated Vital Signs BP (!) 152/79   Pulse 75   Temp 98.3 F (36.8 C) (Oral)   Resp 17   Ht 5' 6 (1.676 m)   Wt 68 kg   SpO2 97%   BMI 24.21 kg/m   Physical Exam Vitals and nursing note reviewed.  Constitutional:      Appearance: She is well-developed.  HENT:     Head: Normocephalic.     Nose: Nose normal.  Eyes:     General: No scleral icterus.    Conjunctiva/sclera: Conjunctivae  normal.  Neck:     Thyroid : No thyromegaly.  Cardiovascular:     Rate and Rhythm: Normal rate and regular rhythm.     Heart sounds: No murmur heard.    No friction rub. No gallop.  Pulmonary:     Breath sounds: No stridor. No wheezing or rales.  Chest:     Chest wall: No tenderness.  Abdominal:     General: There is no distension.     Tenderness: There is no abdominal tenderness. There is no rebound.  Musculoskeletal:        General: Normal range of motion.     Cervical back: Neck supple.  Lymphadenopathy:     Cervical: No cervical adenopathy.  Skin:    Findings: No erythema or rash.   Neurological:     Mental Status: She is alert and oriented to person, place, and time.     Motor: No abnormal muscle tone.     Coordination: Coordination normal.  Psychiatric:        Behavior: Behavior normal.     (all labs ordered are listed, but only abnormal results are displayed) Labs Reviewed  COMPREHENSIVE METABOLIC PANEL WITH GFR - Abnormal; Notable for the following components:      Result Value   Sodium 132 (*)    Chloride 95 (*)    Glucose, Bld 109 (*)    Total Protein 6.4 (*)    All other components within normal limits  CBC WITH DIFFERENTIAL/PLATELET    EKG: None  Radiology: MR BRAIN WO CONTRAST Result Date: 05/05/2024 EXAM: MRI BRAIN WITHOUT CONTRAST 05/05/2024 04:20:00 PM TECHNIQUE: Multiplanar multisequence MRI of the head/brain was performed without the administration of intravenous contrast. COMPARISON: None available. CLINICAL HISTORY: Neuro deficit, acute, stroke suspected; dizziness, ataxia, r/o cva. Acute neurological deficit, stroke suspected; dizziness, ataxia, rule out cerebrovascular accident. FINDINGS: BRAIN AND VENTRICLES: No acute infarct. No intracranial hemorrhage. No mass. No midline shift. No hydrocephalus. The sella is unremarkable. Normal flow voids. T2 and FLAIR hyperintensity in the periventricular and subcortical white matter with more confluent areas of signal abnormality in the bilateral corona radiata. Findings are favored to reflect mild chronic microvascular ischemic changes. There is mild generalized parenchymal volume loss. ORBITS: Right lens replacement. SINUSES AND MASTOIDS: No significant abnormality. BONES AND SOFT TISSUES: Normal marrow signal. No soft tissue abnormality. IMPRESSION: 1. No acute findings. 2. Mild chronic microvascular ischemic changes. Electronically signed by: Donnice Mania MD 05/05/2024 04:56 PM EST RP Workstation: [id number]     Procedures   Medications Ordered in the ED - No data to display                                   Medical Decision Making Amount and/or Complexity of Data Reviewed Labs: ordered.   Patient with dehydration causing her dizziness.  She is improved with IV fluids.  She will stop taking her hydrochlorothiazide and increase her metoprolol      Final diagnoses:  Primary hypertension  Dizziness  Dehydration    ED Discharge Orders     None          Suzette Pac, MD 05/05/24 2035  "

## 2024-05-05 NOTE — Discharge Instructions (Addendum)
 It was our pleasure to provide your ER care today - we hope that you feel better.  Drink plenty of fluids/stay well hydrated. You may take antivert as need for dizziness. No driving for the next 6 hours, or when taking antivert, or if/when feeling dizzy.  Follow up closely with your doctor in the next 2-3 days if symptoms fail to improve/resolve. Also follow up closely with your doctor regarding your blood pressure.   Return to ER if worse, new symptoms, fevers, new/severe pain, fainting, severe dizziness, persistent vomiting, or other concern.

## 2024-05-05 NOTE — ED Provider Notes (Signed)
 " [place] EMERGENCY DEPARTMENT AT [facility] Provider Note   CSN: [id number] Arrival date & time: 05/05/24  1515     Patient presents with: Dizziness   Deanna Erickson is a 79 y.o. female.   Pt c/o dizziness. States since yesterday AM had sense of imbalance when walking, unsteady gait, needing to hold onto things. Denies hx same. Denies room spinning sensation, no severe change in symptoms w head movement. No uri symptoms, no cough, sore throat, nasal/sinus congestion, ear pain/tinnitus/hearing loss, or headache. No hx vertigo. Was seen in ED yesterday for same - indicates antivert makes drowsy, but no significant improvement in symptoms. Denies change in speech or vision. No new numbness or weakness. Denies any change in meds or new meds preceding current symptoms. No faintness or syncope. No fever or chills.   The history is provided by the patient, medical records and the spouse.  Dizziness Associated symptoms: no blood in stool, no chest pain, no headaches, no hearing loss, no nausea, no shortness of breath, no tinnitus, no vomiting and no weakness        Prior to Admission medications  Medication Sig Start Date End Date Taking? Authorizing Provider  aspirin 81 MG tablet Take 81 mg by mouth daily.    [provider]  Calcium Carbonate (CALTRATE 600 PO) Take by mouth.    [provider]  cetirizine (ZYRTEC) 10 MG tablet Take 10 mg by mouth every evening.  Patient not taking: Reported on 09/21/2019    [provider]  Cholecalciferol (VITAMIN D3) 1000 UNITS CAPS Take by mouth.    [provider]  loratadine  (CLARITIN ) 10 MG tablet Take 10 mg by mouth daily. Patient not taking: Reported on 09/21/2019    [provider]  methimazole  (TAPAZOLE ) 5 MG tablet Take 2.5-5 mg by mouth daily. 2.5mg  on Sun,Mon,Wed,Fri 5mg  on Tu,Th,Sat    [provider]  metoprolol  succinate (TOPROL -XL) 25 MG 24 hr tablet Take 25 mg by mouth at  bedtime. Original med rec noted strength as 50mg , patient states now dose is 25mg     [provider]  Multiple Vitamins-Minerals (CENTRUM SPECIALIST HEART PO) Take by mouth.    [provider]  olmesartan (BENICAR) 20 MG tablet Take 20 mg by mouth every morning.     [provider]  Omega-3 Fatty Acids (FISH OIL) 300 MG CAPS Take by mouth.    [provider]  omeprazole (PRILOSEC) 20 MG capsule Take 20 mg by mouth daily.    [provider]  rosuvastatin (CRESTOR) 10 MG tablet SMARTSIG:0.5 Tablet(s) By Mouth Every Other Day 08/11/19   [provider]    Allergies: Ace inhibitors, Nsaids, Saline, and Tolmetin    Review of Systems  Constitutional:  Negative for chills and fever.  HENT:  Negative for congestion, ear pain, hearing loss, rhinorrhea and tinnitus.   Eyes:  Negative for visual disturbance.  Respiratory:  Negative for shortness of breath.   Cardiovascular:  Negative for chest pain.  Gastrointestinal:  Negative for abdominal pain, blood in stool, nausea and vomiting.  Genitourinary:  Negative for dysuria and flank pain.  Musculoskeletal:  Negative for back pain and neck pain.  Skin:  Negative for rash.  Neurological:  Positive for dizziness. Negative for syncope, speech difficulty, weakness, numbness and headaches.  Psychiatric/Behavioral:  Negative for confusion.     Updated Vital Signs BP (!) 175/77 (BP Location: Right Arm)   Pulse 66   Temp 97.7 F (36.5 C)  Resp 16   SpO2 96%   Physical Exam Vitals and nursing note reviewed.  Constitutional:      Appearance: Normal appearance. She is well-developed.  HENT:     Head: Atraumatic.     Comments: No temporal, sinus or mastoid tenderness.     Right Ear: Tympanic membrane normal.     Left Ear: Tympanic membrane normal.     Nose: Nose normal.     Mouth/Throat:     Mouth: Mucous membranes are moist.  Eyes:     General: No scleral icterus.    Extraocular Movements:  Extraocular movements intact.     Conjunctiva/sclera: Conjunctivae normal.     Pupils: Pupils are equal, round, and reactive to light.  Neck:     Vascular: No carotid bruit.     Trachea: No tracheal deviation.     Comments: Trachea midline, thyroid  not grossly enlarged or tender. No neck stiffness or rigidity.  Cardiovascular:     Rate and Rhythm: Normal rate and regular rhythm.     Pulses: Normal pulses.     Heart sounds: Normal heart sounds. No murmur heard.    No friction rub. No gallop.  Pulmonary:     Effort: Pulmonary effort is normal. No respiratory distress.     Breath sounds: Normal breath sounds.  Abdominal:     General: There is no distension.     Palpations: Abdomen is soft. There is no mass.     Tenderness: There is no abdominal tenderness. There is no guarding.  Genitourinary:    Comments: No cva tenderness.  Musculoskeletal:        General: No swelling or tenderness.     Cervical back: Normal range of motion and neck supple. No rigidity. No muscular tenderness.     Right lower leg: No edema.     Left lower leg: No edema.  Skin:    General: Skin is warm and dry.     Findings: No rash.  Neurological:     Mental Status: She is alert.     Comments: Alert, speech normal. No dysarthria or aphasia. Motor/sens fxn grossly intact bil, stre 5/5. No pronator drift. Finger to nose and heel to shin grossly intact/normal bil. Gait is unsteady, mildly ataxic. Transient horizontal/unidirectional nystagmus  Psychiatric:        Mood and Affect: Mood normal.     (all labs ordered are listed, but only abnormal results are displayed) Results for orders placed or performed during the hospital encounter of 05/04/24  CBC with Differential   Collection Time: 05/04/24  8:35 AM  Result Value Ref Range   WBC 6.2 4.0 - 10.5 K/uL   RBC 4.21 3.87 - 5.11 MIL/uL   Hemoglobin 13.3 12.0 - 15.0 g/dL   HCT 61.1 63.9 - 53.9 %   MCV 92.2 80.0 - 100.0 fL   MCH 31.6 26.0 - 34.0 pg   MCHC 34.3  30.0 - 36.0 g/dL   RDW 87.5 88.4 - 84.4 %   Platelets 244 150 - 400 K/uL   nRBC 0.0 0.0 - 0.2 %   Neutrophils Relative % 67 %   Neutro Abs 4.1 1.7 - 7.7 K/uL   Lymphocytes Relative 20 %   Lymphs Abs 1.3 0.7 - 4.0 K/uL   Monocytes Relative 10 %   Monocytes Absolute 0.6 0.1 - 1.0 K/uL   Eosinophils Relative 2 %   Eosinophils Absolute 0.1 0.0 - 0.5 K/uL   Basophils Relative 1 %   Basophils Absolute  0.0 0.0 - 0.1 K/uL   Immature Granulocytes 0 %   Abs Immature Granulocytes 0.02 0.00 - 0.07 K/uL  Comprehensive metabolic panel   Collection Time: 05/04/24  8:35 AM  Result Value Ref Range   Sodium 132 (L) 135 - 145 mmol/L   Potassium 4.5 3.5 - 5.1 mmol/L   Chloride 95 (L) 98 - 111 mmol/L   CO2 27 22 - 32 mmol/L   Glucose, Bld 109 (H) 70 - 99 mg/dL   BUN 10 8 - 23 mg/dL   Creatinine, Ser 9.29 0.44 - 1.00 mg/dL   Calcium 9.6 8.9 - 89.6 mg/dL   Total Protein 6.4 (L) 6.5 - 8.1 g/dL   Albumin 4.3 3.5 - 5.0 g/dL   AST 21 15 - 41 U/L   ALT 23 0 - 44 U/L   Alkaline Phosphatase 42 38 - 126 U/L   Total Bilirubin 0.5 0.0 - 1.2 mg/dL   GFR, Estimated >39 >39 mL/min   Anion gap 10 5 - 15     EKG: EKG Interpretation Date/Time:  Saturday May 05 2024 16:34:12 EST Ventricular Rate:  57 PR Interval:  172 QRS Duration:  92 QT Interval:  419 QTC Calculation: 408 R Axis:   75  Text Interpretation: Sinus rhythm Non-specific ST-t changes Confirmed by Bernard Drivers ([id number]) on 05/05/2024 4:42:16 PM  Radiology: MR BRAIN WO CONTRAST Result Date: 05/05/2024 EXAM: MRI BRAIN WITHOUT CONTRAST 05/05/2024 04:20:00 PM TECHNIQUE: Multiplanar multisequence MRI of the head/brain was performed without the administration of intravenous contrast. COMPARISON: None available. CLINICAL HISTORY: Neuro deficit, acute, stroke suspected; dizziness, ataxia, r/o cva. Acute neurological deficit, stroke suspected; dizziness, ataxia, rule out cerebrovascular accident. FINDINGS: BRAIN AND VENTRICLES: No acute infarct. No  intracranial hemorrhage. No mass. No midline shift. No hydrocephalus. The sella is unremarkable. Normal flow voids. T2 and FLAIR hyperintensity in the periventricular and subcortical white matter with more confluent areas of signal abnormality in the bilateral corona radiata. Findings are favored to reflect mild chronic microvascular ischemic changes. There is mild generalized parenchymal volume loss. ORBITS: Right lens replacement. SINUSES AND MASTOIDS: No significant abnormality. BONES AND SOFT TISSUES: Normal marrow signal. No soft tissue abnormality. IMPRESSION: 1. No acute findings. 2. Mild chronic microvascular ischemic changes. Electronically signed by: Donnice Mania MD 05/05/2024 04:56 PM EST RP Workstation: [id number]     Procedures   Medications Ordered in the ED  LORazepam  (ATIVAN ) tablet 0.5 mg (has no administration in time range)                                    Medical Decision Making Problems Addressed: Dizziness: acute illness or injury with systemic symptoms that poses a threat to life or bodily functions Elevated blood pressure reading: acute illness or injury Essential hypertension: chronic illness or injury with exacerbation, progression, or side effects of treatment that poses a threat to life or bodily functions  Amount and/or Complexity of Data Reviewed Independent Historian: spouse    Details: hx External Data Reviewed: notes. Labs: ordered. Decision-making details documented in ED Course. Radiology: ordered and independent interpretation performed. Decision-making details documented in ED Course. ECG/medicine tests: ordered and independent interpretation performed. Decision-making details documented in ED Course.  Risk Prescription drug management. Decision regarding hospitalization.   Iv ns. Continuous pulse ox and cardiac monitoring. Review recent labs.  Imaging ordered.   Differential diagnosis includes posterior circ cva, vertigo, dizziness,  orthostasis, etc. Dispo decision  including potential need for admission considered - will get labs and imaging and reassess.   Reviewed nursing notes and prior charts for additional history. External reports reviewed. Additional history from: spouse.   Cardiac monitor: sinus rhythm, rate 66.  Recent labs from yesterday reviewed/interpreted by me - wbc and hgb normal.   Pt denies any dysuria, odor to urine, frequency, flank pain or any gu/uti symptoms.   MRI reviewed/interpreted by me - no acute cva.   Po fluids/food.   Recheck, pt comfortable, no nv.   Pt currently appears stable for ED d/c.   Rec close pcp f/u.  Return precautions provided.       Final diagnoses:  Dizziness  Elevated blood pressure reading  Essential hypertension    ED Discharge Orders     None          Bernard Drivers, MD 05/05/24 1804  "
